# Patient Record
Sex: Female | Born: 1970 | Hispanic: Yes | Marital: Married | Smoking: Never smoker
Health system: Southern US, Community
[De-identification: ages and names within clinical notes are randomized; demographics above are authoritative.]

## PROBLEM LIST (undated history)

## (undated) DIAGNOSIS — N946 Dysmenorrhea, unspecified: Secondary | ICD-10-CM

## (undated) DIAGNOSIS — N921 Excessive and frequent menstruation with irregular cycle: Secondary | ICD-10-CM

## (undated) HISTORY — PX: UMBILICAL HERNIA REPAIR: SHX196

## (undated) HISTORY — DX: Excessive and frequent menstruation with irregular cycle: N92.1

## (undated) HISTORY — DX: Dysmenorrhea, unspecified: N94.6

## (undated) HISTORY — PX: TONSILLECTOMY: SUR1361

---

## 1999-05-09 ENCOUNTER — Other Ambulatory Visit: Admission: RE | Admit: 1999-05-09 | Discharge: 1999-05-09 | Payer: Self-pay | Admitting: Gynecology

## 1999-07-04 ENCOUNTER — Encounter: Admission: RE | Admit: 1999-07-04 | Discharge: 1999-07-04 | Payer: Self-pay | Admitting: Family Medicine

## 1999-07-14 ENCOUNTER — Other Ambulatory Visit: Admission: RE | Admit: 1999-07-14 | Discharge: 1999-07-14 | Payer: Self-pay | Admitting: Obstetrics & Gynecology

## 1999-07-14 ENCOUNTER — Encounter: Admission: RE | Admit: 1999-07-14 | Discharge: 1999-07-14 | Payer: Self-pay | Admitting: Family Medicine

## 1999-07-20 ENCOUNTER — Ambulatory Visit (HOSPITAL_COMMUNITY): Admission: RE | Admit: 1999-07-20 | Discharge: 1999-07-20 | Payer: Self-pay | Admitting: Family Medicine

## 1999-08-02 ENCOUNTER — Ambulatory Visit (HOSPITAL_COMMUNITY): Admission: RE | Admit: 1999-08-02 | Discharge: 1999-08-02 | Payer: Self-pay | Admitting: Family Medicine

## 1999-10-05 ENCOUNTER — Encounter: Admission: RE | Admit: 1999-10-05 | Discharge: 1999-10-05 | Payer: Self-pay | Admitting: Sports Medicine

## 1999-10-13 ENCOUNTER — Encounter: Admission: RE | Admit: 1999-10-13 | Discharge: 1999-10-13 | Payer: Self-pay | Admitting: Family Medicine

## 1999-11-01 ENCOUNTER — Encounter: Admission: RE | Admit: 1999-11-01 | Discharge: 1999-11-01 | Payer: Self-pay | Admitting: Family Medicine

## 1999-12-26 ENCOUNTER — Encounter: Admission: RE | Admit: 1999-12-26 | Discharge: 1999-12-26 | Payer: Self-pay | Admitting: Sports Medicine

## 2000-01-02 ENCOUNTER — Encounter: Admission: RE | Admit: 2000-01-02 | Discharge: 2000-01-02 | Payer: Self-pay | Admitting: *Deleted

## 2000-01-02 ENCOUNTER — Inpatient Hospital Stay (HOSPITAL_COMMUNITY): Admission: AD | Admit: 2000-01-02 | Discharge: 2000-01-04 | Payer: Self-pay | Admitting: Obstetrics

## 2000-02-23 ENCOUNTER — Encounter: Admission: RE | Admit: 2000-02-23 | Discharge: 2000-02-23 | Payer: Self-pay | Admitting: Obstetrics & Gynecology

## 2001-12-05 ENCOUNTER — Encounter: Payer: Self-pay | Admitting: Obstetrics

## 2001-12-05 ENCOUNTER — Ambulatory Visit (HOSPITAL_COMMUNITY): Admission: RE | Admit: 2001-12-05 | Discharge: 2001-12-05 | Payer: Self-pay | Admitting: Obstetrics

## 2002-07-23 ENCOUNTER — Encounter (INDEPENDENT_AMBULATORY_CARE_PROVIDER_SITE_OTHER): Payer: Self-pay | Admitting: *Deleted

## 2002-07-23 ENCOUNTER — Ambulatory Visit (HOSPITAL_BASED_OUTPATIENT_CLINIC_OR_DEPARTMENT_OTHER): Admission: RE | Admit: 2002-07-23 | Discharge: 2002-07-23 | Payer: Self-pay | Admitting: Surgery

## 2003-10-05 ENCOUNTER — Encounter: Admission: RE | Admit: 2003-10-05 | Discharge: 2003-10-05 | Payer: Self-pay | Admitting: Obstetrics

## 2004-03-27 ENCOUNTER — Encounter: Admission: RE | Admit: 2004-03-27 | Discharge: 2004-03-27 | Payer: Self-pay | Admitting: Obstetrics

## 2005-02-08 ENCOUNTER — Ambulatory Visit (HOSPITAL_COMMUNITY): Admission: RE | Admit: 2005-02-08 | Discharge: 2005-02-08 | Payer: Self-pay | Admitting: Obstetrics

## 2010-07-09 ENCOUNTER — Encounter: Payer: Self-pay | Admitting: Obstetrics

## 2010-11-03 NOTE — Op Note (Signed)
[name], Christine Mckinney                          ACCOUNT NO.:  [id number]   MEDICAL RECORD NO.:  [id number]                   PATIENT TYPE:  AMB   LOCATION:  DSC                                  FACILITY:   PHYSICIAN:  Currie Paris, M.D.           DATE OF BIRTH:  [birth year]   DATE OF PROCEDURE:  07/23/2002  DATE OF DISCHARGE:                                 OPERATIVE REPORT   OFFICE CHART NUMBER:  [id number].   PREOPERATIVE DIAGNOSIS:  Lipoma upper back, lower neck area.   POSTOPERATIVE DIAGNOSIS:  Lipoma upper back, lower neck area.   OPERATION:  Excision mass posterior upper back, lower neck area.   SURGEON:  Currie Paris, M.D.   ANESTHESIA:  General.   CLINICAL HISTORY:  This patient has presented with a mass to the back of the  neck just about the shoulder level in the midline which is sore and she  thought it was getting a little bit bigger and she wished to have this  removed.   DESCRIPTION OF PROCEDURE:  The patient was seen in the holding area.  The  mass was identified and marked.  She had no further questions and was  prepared and ready for surgery.  The patient was taken to the operating room  and after satisfactory general anesthesia had been obtained was turned to  the prone position.  The area over the mass was prepped and draped.  I  injected 0.25% plain Marcaine to help with postop analgesia.  I made a  transverse incision over the mass for the length of the mass and divided  some subcutaneous tissue with cautery.  This appeared to be primarily fatty  tissue with a lot of fibrous bands around it and using cautery I elevated  some fairly thick skin flaps going in all directions and then came under the  mass itself.  Again, this appeared to be all lipomatous type tissue within  fibrous bands but I left a fatty tissue posteriorly as well so we did not  get down on the deep margin to muscle fascia.  Once this was excised the  bleeding was controlled  with the cautery and I injected a little bit more  Marcaine to help, again, with postoperative analgesia.   The incision was closed with some 3-0 Vicryl to tack the flaps down so they  would be less likely to have a seroma and then the skin with 4-0 Monocryl  and subcuticular with Steri-Strips.  The patient tolerated the procedure  well and no intraoperative complications and all counts were correct.                                               Currie Paris, M.D.    CJS/MEDQ  D:  07/23/2002  T:  07/23/2002  Job:  [id number]   cc:   Gabriel Earing, M.D.  [address]  [place]  [place] [address]  Fax: [phone]

## 2010-11-03 NOTE — H&P (Signed)
[facility]  Patient:    Christine Mckinney, Christine Mckinney                       MRN: [id number] Adm. Date:  [id number] Disc. Date: [id number] Attending:  Tammi Sou CC:         [facility]   History and Physical  CHIEF COMPLAINT:              The patient is a 40 year old para 3, who desires a sterilization procedure.  HISTORY OF PRESENT ILLNESS:   The patient has used Depo-Provera and oral contraceptives in the past.  The risks, benefits, and alternative forms of contraception were reviewed with the patient, including, but not limited to the risk of failure of 4-01/999 cases, with a subsequent increased risk of an ectopic gestation.  ALLERGIES:                    No known drug allergies.  CURRENT MEDICATIONS:          None.  PAST OB/GYN HISTORY:          She is status post one cesarean delivery and two spontaneous vaginal deliveries.  She denies any sexually-transmitted diseases. A Pap smear from January 2001, was within normal limits.  PAST MEDICAL HISTORY:         She denies past surgical history.  As above.  SOCIAL HISTORY:               She is married.  She denies any tobacco, ethanol, or drug use.  FAMILY HISTORY:               Remarkable for diabetes mellitus and osteoporosis.  PHYSICAL EXAMINATION:  VITAL SIGNS:                  Pulse 83, blood pressure 100/60, weight 99.8 pounds.  GENERAL:                      She is well-developed, well-nourished, in no apparent distress.  HEENT:                        Normocephalic, atraumatic.  NECK:                         Supple, no thyromegaly.  LUNGS:                        Clear to auscultation bilaterally.  HEART:                        Regular rate and rhythm.  ABDOMEN:                      No organomegaly.  PELVIC:                       Normal external female genitalia.  On speculum examination there is a flocculent white discharge noted.  BIMANUAL EXAMINATION:          Uterus is small, retroverted, nontender.  The right adnexa is somewhat full, nontender.  The left adnexa is palpable, nontender.  No masses.  EXTREMITIES:                  No cyanosis, clubbing, or edema.  ASSESSMENT:  Multiparity, desires a sterilization procedure.  PLAN:                         Laparoscopic bilateral tubal ligation. DD:  02/23/00 TD:  02/23/00 Job: [id number]

## 2011-02-12 ENCOUNTER — Other Ambulatory Visit: Payer: Self-pay | Admitting: Obstetrics

## 2012-09-09 ENCOUNTER — Encounter: Payer: Self-pay | Admitting: *Deleted

## 2012-09-11 ENCOUNTER — Ambulatory Visit (INDEPENDENT_AMBULATORY_CARE_PROVIDER_SITE_OTHER): Payer: BC Managed Care – PPO | Admitting: Obstetrics

## 2012-09-11 ENCOUNTER — Encounter: Payer: Self-pay | Admitting: Obstetrics

## 2012-09-11 VITALS — BP 129/79 | HR 81 | Temp 98.9°F | Ht 62.0 in | Wt 119.0 lb

## 2012-09-11 DIAGNOSIS — Z131 Encounter for screening for diabetes mellitus: Secondary | ICD-10-CM

## 2012-09-11 DIAGNOSIS — Z13220 Encounter for screening for lipoid disorders: Secondary | ICD-10-CM

## 2012-09-11 DIAGNOSIS — Z124 Encounter for screening for malignant neoplasm of cervix: Secondary | ICD-10-CM

## 2012-09-11 DIAGNOSIS — N949 Unspecified condition associated with female genital organs and menstrual cycle: Secondary | ICD-10-CM

## 2012-09-11 DIAGNOSIS — Z1239 Encounter for other screening for malignant neoplasm of breast: Secondary | ICD-10-CM

## 2012-09-11 DIAGNOSIS — Z01419 Encounter for gynecological examination (general) (routine) without abnormal findings: Secondary | ICD-10-CM

## 2012-09-11 MED ORDER — IBUPROFEN 800 MG PO TABS: 800.0000 mg | ORAL_TABLET | Freq: Three times a day (TID) | ORAL | Status: DC | PRN

## 2012-09-11 MED ORDER — HYDROCODONE-IBUPROFEN 7.5-200 MG PO TABS: 1.0000 | ORAL_TABLET | Freq: Three times a day (TID) | ORAL | Status: DC | PRN

## 2012-09-11 MED ORDER — PRENATAL PLUS 27-1 MG PO TABS: 1.0000 | ORAL_TABLET | Freq: Every day | ORAL | Status: DC

## 2012-09-11 NOTE — Patient Instructions (Signed)
Routine well woman instructions.

## 2012-09-11 NOTE — Progress Notes (Signed)
  Subjective:     Christine Mckinney is a 42 y.o. female and is here for a comprehensive physical exam. The patient reports problems - LLQ pain off and on..  History   Social History  . Marital Status: Married    Spouse Name: Louie Casa     Number of Children: 3  . Years of Education: N/A   Occupational History  . Inspector    Social History Main Topics  . Smoking status: Never Smoker   . Smokeless tobacco: Never Used  . Alcohol Use: No  . Drug Use: No  . Sexually Active: Yes -- Female partner(s)     Comment: spouse had vasectomy   Other Topics Concern  . Not on file   Social History Narrative  . No narrative on file   Health Maintenance  Topic Date Due  . Influenza Vaccine  02/16/1973  . Tetanus/tdap  07/01/1991  . Pap Smear  01/28/2014    The following portions of the patient's history were reviewed and updated as appropriate: allergies, current medications, past family history, past medical history, past social history, past surgical history and problem list.  Review of Systems A comprehensive review of systems was negative except for: LLQ pain   Objective:  Breasts: Normal Pelvic:  NEFG              Vagina and cervix normal.               Cervix normal.  Pap done                Uterus:  NSSC.  NT                 Adnexa:  Tender left.  No masses.      Assessment:    Healthy female exam.  Pelvic pain.  R/O ovarian cyst on left side.      Plan:     Pelvic ultrasound and mammogram ordered.  Patient will return for fasting labs. See After Visit Summary for Counseling Recommendations

## 2012-09-12 LAB — PAP IG W/ RFLX HPV ASCU

## 2012-09-18 ENCOUNTER — Ambulatory Visit (HOSPITAL_COMMUNITY)
Admission: RE | Admit: 2012-09-18 | Discharge: 2012-09-18 | Disposition: A | Discharge status: Home / Self Care | Payer: BC Managed Care – PPO | Source: Ambulatory Visit | Attending: Obstetrics | Admitting: Obstetrics

## 2012-09-18 DIAGNOSIS — Z1231 Encounter for screening mammogram for malignant neoplasm of breast: Secondary | ICD-10-CM | POA: Insufficient documentation

## 2012-09-18 DIAGNOSIS — Z01419 Encounter for gynecological examination (general) (routine) without abnormal findings: Secondary | ICD-10-CM

## 2013-08-28 ENCOUNTER — Ambulatory Visit (INDEPENDENT_AMBULATORY_CARE_PROVIDER_SITE_OTHER): Payer: BC Managed Care – PPO | Admitting: Internal Medicine

## 2013-08-28 ENCOUNTER — Ambulatory Visit: Payer: BC Managed Care – PPO

## 2013-08-28 VITALS — BP 110/70 | HR 85 | Temp 98.0°F | Resp 16 | Ht 61.0 in | Wt 129.0 lb

## 2013-08-28 DIAGNOSIS — M25512 Pain in left shoulder: Secondary | ICD-10-CM

## 2013-08-28 DIAGNOSIS — R221 Localized swelling, mass and lump, neck: Secondary | ICD-10-CM

## 2013-08-28 DIAGNOSIS — M25519 Pain in unspecified shoulder: Secondary | ICD-10-CM

## 2013-08-28 DIAGNOSIS — M542 Cervicalgia: Secondary | ICD-10-CM

## 2013-08-28 DIAGNOSIS — R220 Localized swelling, mass and lump, head: Secondary | ICD-10-CM

## 2013-08-28 MED ORDER — HYDROCODONE-ACETAMINOPHEN 5-325 MG PO TABS: 1.0000 | ORAL_TABLET | Freq: Four times a day (QID) | ORAL | Status: DC | PRN

## 2013-08-28 MED ORDER — PREDNISONE 10 MG PO TABS: ORAL_TABLET | ORAL | Status: DC

## 2013-08-28 NOTE — Progress Notes (Signed)
   Subjective:    Patient ID: Christine Mckinney, female    DOB: Sep 26, 1970, 43 y.o.   MRN: [id number]  HPI Patient presents with left sided neck pain that is now radiating down into left shoulder and left arm.  Patient says its very painful to use her neck and arm.  Patient complains of neck pain for 2 to 3 months.Only a week ago did her shoulder and arm become painful.  She saw her PCP on Monday.  She was given Flexeril and Naproxen.  Neither have helped very much. Also states swelling has recently occurred base of left neck and base of cspine at site of lipoma removal, there is swelling below scar. She is new to our clinic and [facility]. She has 2 sisters with 3 cancers! Review of Systems Hx surgery on neck mass 10 yrs ago    Objective:   Physical Exam  Constitutional: She is oriented to person, place, and time. She appears well-developed and well-nourished.  HENT:  Head: Normocephalic.  Eyes: EOM are normal. Pupils are equal, round, and reactive to light.  Neck: Normal range of motion.  Pulmonary/Chest: Effort normal.  Musculoskeletal:       Cervical back: She exhibits tenderness, bony tenderness, swelling, deformity and pain. She exhibits normal range of motion and normal pulse.  Lymphadenopathy:    She has no cervical adenopathy.  Neurological: She is alert and oriented to person, place, and time. No cranial nerve deficit. She exhibits normal muscle tone. Coordination normal.  Skin: No rash noted.  Psychiatric: She has a normal mood and affect. Her behavior is normal. Judgment and thought content normal.      [facility] Policy for Prescribing Controlled Substances (Revised 04/2012) 1. Prescriptions for controlled substances will be filled by ONE provider at [facility] with whom you have established and developed a plan for your care, including follow-up. 2. You are encouraged to schedule an appointment with your prescriber at our appointment center for follow-up visits  whenever possible. 3. If you request a prescription for the controlled substance while at [facility] an acute problem (with someone other than your regular prescriber), you MAY be given a ONE-TIME prescription for a 30-day supply of the controlled substance, to allow time for you to return to see your regular prescriber for additional prescriptions.[facility]  [facility] reading (PRIMARY) by  Dr.Madeleyn Schwimmer cspine appears normal, prominent soft tissue mass base of neck      Assessment & Plan:  Neck radiating to left shoulder pain/Neck manual Neck swelling progressive cause unclear/Soft mass Refer for MRI neck mass r/o liposarcoma Prednisone/Vicodin F/up closely

## 2013-08-28 NOTE — Patient Instructions (Signed)

## 2013-09-08 ENCOUNTER — Ambulatory Visit
Admission: RE | Admit: 2013-09-08 | Discharge: 2013-09-08 | Disposition: A | Discharge status: Home / Self Care | Payer: BC Managed Care – PPO | Source: Ambulatory Visit | Attending: Internal Medicine | Admitting: Internal Medicine

## 2013-09-08 DIAGNOSIS — R221 Localized swelling, mass and lump, neck: Secondary | ICD-10-CM

## 2013-09-08 DIAGNOSIS — M542 Cervicalgia: Secondary | ICD-10-CM

## 2013-09-08 DIAGNOSIS — M25512 Pain in left shoulder: Secondary | ICD-10-CM

## 2013-09-08 MED ORDER — GADOBENATE DIMEGLUMINE 529 MG/ML IV SOLN
10.0000 mL | Freq: Once | INTRAVENOUS | Status: AC | PRN
  Administered 2013-09-08: 10 mL via INTRAVENOUS

## 2013-09-09 ENCOUNTER — Telehealth: Payer: Self-pay

## 2013-09-09 ENCOUNTER — Ambulatory Visit: Payer: BC Managed Care – PPO

## 2013-09-09 ENCOUNTER — Ambulatory Visit (INDEPENDENT_AMBULATORY_CARE_PROVIDER_SITE_OTHER): Payer: BC Managed Care – PPO | Admitting: Family Medicine

## 2013-09-09 VITALS — BP 108/78 | HR 90 | Temp 98.5°F | Resp 18 | Wt 134.0 lb

## 2013-09-09 DIAGNOSIS — M25519 Pain in unspecified shoulder: Secondary | ICD-10-CM

## 2013-09-09 DIAGNOSIS — M5010 Cervical disc disorder with radiculopathy, unspecified cervical region: Secondary | ICD-10-CM

## 2013-09-09 DIAGNOSIS — R202 Paresthesia of skin: Secondary | ICD-10-CM

## 2013-09-09 DIAGNOSIS — R209 Unspecified disturbances of skin sensation: Secondary | ICD-10-CM

## 2013-09-09 DIAGNOSIS — M542 Cervicalgia: Secondary | ICD-10-CM

## 2013-09-09 DIAGNOSIS — M5412 Radiculopathy, cervical region: Secondary | ICD-10-CM

## 2013-09-09 MED ORDER — MELOXICAM 15 MG PO TABS: 15.0000 mg | ORAL_TABLET | Freq: Every day | ORAL | Status: DC

## 2013-09-09 MED ORDER — METHOCARBAMOL 500 MG PO TABS: 500.0000 mg | ORAL_TABLET | Freq: Four times a day (QID) | ORAL | Status: DC | PRN

## 2013-09-09 MED ORDER — OXYCODONE-ACETAMINOPHEN 5-325 MG PO TABS: 1.0000 | ORAL_TABLET | Freq: Four times a day (QID) | ORAL | Status: DC | PRN

## 2013-09-09 NOTE — Progress Notes (Signed)
Chief Complaint:  Chief Complaint  Patient presents with  . Follow-up    neck/shoulder pain; had MRI yesterday; wants cortisone shot    HPI: Christine Mckinney is a 43 y.o. female who is here for  3 month history of neck and shoulder pain.  She has associated numbness and tingling that travels on the back of her shoulder blade to the lateral deltoid and down her inner arm. She has pain, weakness and n/t.  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 No prior neck or shoulder injury Has numbness , tingling, constant pain, 10/10 , sharp jabbing pain. She cannot sleep or get comfortable during the day  On her last visit she was given vicoprofen  and prednisone and that did not help, bothered her stomach , helps for a little bit but does not want to take pills all the time. She wants an injection or the problem to be fixed since it bothers her so much.  She was on naproxen and flexeril in the past and that did not help  She had MRI of soft tissue neck done to rule out liposarcoma today, 09/09/2013  IMPRESSION:  Abundant subcutaneous fat posteriorly at the junction of the neck  and back. The appearance is nonspecific. There is no definable  well-circumscribed lipoma.  No other soft tissue finding in the neck.  Left posterior lateral disc herniation at C5-6 that could be a cause  of left-sided pain. Detailed exam of the cervical spine was not  performed.    Past Medical History  Diagnosis Date  . Dysmenorrhea   . Metrorrhagia    Past Surgical History  Procedure Laterality Date  . Umbilical hernia repair     History   Social History  . Marital Status: Married    Spouse Name: Louie CasaHector Guevara     Number of Children: 3  . Years of Education: N/A   Occupational History  . Inspector    Social History Main Topics  . Smoking status: Never Smoker   . Smokeless tobacco: Never Used  . Alcohol Use: No  . Drug  Use: No  . Sexual Activity: Yes    Partners: Male     Comment: spouse had vasectomy   Other Topics Concern  . None   Social History Narrative  . None   Family History  Problem Relation Age of Onset  . Diabetes Mellitus II    . Heart disease    . Cervical cancer    . Osteoporosis    . Hyperlipidemia     No Known Allergies Prior to Admission medications   Medication Sig Start Date End Date Taking? Authorizing Provider  ibuprofen (ADVIL,MOTRIN) 800 MG tablet Take 1 tablet (800 mg total) by mouth every 8 (eight) hours as needed for pain. 09/11/12  Yes Brock Badharles A Harper, MD  naproxen sodium (ANAPROX) 550 MG tablet Take 550 mg by mouth 2 (two) times daily with a meal.   Yes Historical Provider, MD  prenatal vitamin w/FE, FA (PRENATAL 1 + 1) 27-1 MG TABS Take 1 tablet by mouth daily at 12 noon. 09/11/12  Yes Brock Badharles A Harper, MD  cyclobenzaprine (FLEXERIL) 10 MG tablet Take 10 mg by mouth 3 (three) times daily as needed for muscle spasms.    Historical Provider, MD  HYDROcodone-acetaminophen (NORCO/VICODIN) 5-325 MG per tablet Take 1 tablet by mouth every 6 (six) hours as needed. 08/28/13  Jonita Albee, MD  HYDROcodone-ibuprofen (VICOPROFEN) 7.5-200 MG per tablet Take 1 tablet by mouth every 8 (eight) hours as needed for pain. 09/11/12   Brock Bad, MD  predniSONE (DELTASONE) 10 MG tablet 6-5-09-19-19-po pc for nerve pain 08/28/13   Jonita Albee, MD     ROS: The patient denies fevers, chills, night sweats, unintentional weight loss, chest pain, palpitations, wheezing, dyspnea on exertion, nausea, vomiting, abdominal pain, dysuria, hematuria, melena,+  numbness, weakness, or tingling.   All other systems have been reviewed and were otherwise negative with the exception of those mentioned in the HPI and as above.    PHYSICAL EXAM: Filed Vitals:   09/09/13 1638  BP: 108/78  Pulse: 90  Temp: 98.5 F (36.9 C)  Resp: 18   Filed Vitals:   09/09/13 1638  Weight: 134 lb (60.782 kg)    Body mass index is 25.33 kg/(m^2).  General: Alert, mild-moderate acute distress HEENT:  Normocephalic, atraumatic, oropharynx patent. EOMI, PERRLA Cardiovascular:  Regular rate and rhythm, no rubs murmurs or gallops.  No Carotid bruits, radial pulse intact. No pedal edema.  Respiratory: Clear to auscultation bilaterally.  No wheezes, rales, or rhonchi.  No cyanosis, no use of accessory musculature GI: No organomegaly, abdomen is soft and non-tender, positive bowel sounds.  No masses. Skin: No rashes. Neurologic: Facial musculature symmetric. Psychiatric: Patient is appropriate throughout our interaction. Lymphatic: No cervical lymphadenopathy Musculoskeletal: Gait intact. Decrease ROM neck due to pain Decrease ROM left shoulder due to pain, however when she is able to push through pain , she is able to get full ROM 4/5 strength ,decrease sensation to light touch Distribution is along c 5-6 Neg empty can   LABS: Results for orders placed in visit on 09/11/12  PAP IG W/ RFLX HPV ASCU      Result Value Ref Range   Specimen adequacy:       FINAL DIAGNOSIS:       COMMENTS:       Cytotechnologist:         EKG/XRAY:   Primary read interpreted by Dr. Conley Rolls at [facility]. Neg for fracture /dislocation     ASSESSMENT/PLAN: Encounter Diagnoses  Name Primary?  . Shoulder pain   . Neck pain   . Paresthesia   . Cervical disc disorder with radiculopathy of cervical region Yes    Needs referral to neuorsurgery for worsening cervical radicular pain c4-6 She wanted an injection , she would not have benefited from it.  Shoulder xrays neg for DJD, fx,dislocation Stop allprior meds Rx Robaxin, Mobic, and also Percocet Work note given, patient was given all xrays and report of soft tissue MRI Fu prn  Gross sideeffects, risk and benefits, and alternatives of medications d/w patient. Patient is aware that all medications have potential sideeffects and we are unable to predict every sideeffect  or drug-drug interaction that may occur.  Rockne Coons, DO 09/09/2013 9:24 PM   09/10/2013 Spoke with referral coordinator , Thayer Ohm,  and she will contact pt when she gets referral forms from our office The patient is tentatively scheduled to see neurosurgery on April 1 @ 12:30 [facility] [address] [place], [place] [address] Phone:[phone] Fax : [phone]

## 2013-09-09 NOTE — Patient Instructions (Signed)
Cervical Radiculopathy  Cervical radiculopathy happens when a nerve in the neck is pinched or bruised by a slipped (herniated) disk or by arthritic changes in the bones of the cervical spine. This can occur due to an injury or as part of the normal aging process. Pressure on the cervical nerves can cause pain or numbness that runs from your neck all the way down into your arm and fingers.  CAUSES   There are many possible causes, including:  · Injury.  · Muscle tightness in the neck from overuse.  · Swollen, painful joints (arthritis).  · Breakdown or degeneration in the bones and joints of the spine (spondylosis) due to aging.  · Bone spurs that may develop near the cervical nerves.  SYMPTOMS   Symptoms include pain, weakness, or numbness in the affected arm and hand. Pain can be severe or irritating. Symptoms may be worse when extending or turning the neck.  DIAGNOSIS   Your caregiver will ask about your symptoms and do a physical exam. He or she may test your strength and reflexes. X-rays, CT scans, and MRI scans may be needed in cases of injury or if the symptoms do not go away after a period of time. Electromyography (EMG) or nerve conduction testing may be done to study how your nerves and muscles are working.  TREATMENT   Your caregiver may recommend certain exercises to help relieve your symptoms. Cervical radiculopathy can, and often does, get better with time and treatment. If your problems continue, treatment options may include:  · Wearing a soft collar for short periods of time.  · Physical therapy to strengthen the neck muscles.  · Medicines, such as nonsteroidal anti-inflammatory drugs (NSAIDs), oral corticosteroids, or spinal injections.  · Surgery. Different types of surgery may be done depending on the cause of your problems.  HOME CARE INSTRUCTIONS   · Put ice on the affected area.  · Put ice in a plastic bag.  · Place a towel between your skin and the bag.  · Leave the ice on for 15-20 minutes,  03-04 times a day or as directed by your caregiver.  · If ice does not help, you can try using heat. Take a warm shower or bath, or use a hot water bottle as directed by your caregiver.  · You may try a gentle neck and shoulder massage.  · Use a flat pillow when you sleep.  · Only take over-the-counter or prescription medicines for pain, discomfort, or fever as directed by your caregiver.  · If physical therapy was prescribed, follow your caregiver's directions.  · If a soft collar was prescribed, use it as directed.  SEEK IMMEDIATE MEDICAL CARE IF:   · Your pain gets much worse and cannot be controlled with medicines.  · You have weakness or numbness in your hand, arm, face, or leg.  · You have a high fever or a stiff, rigid neck.  · You lose bowel or bladder control (incontinence).  · You have trouble with walking, balance, or speaking.  MAKE SURE YOU:   · Understand these instructions.  · Will watch your condition.  · Will get help right away if you are not doing well or get worse.  Document Released: 02/27/2001 Document Revised: 08/27/2011 Document Reviewed: 01/16/2011  [employer]® Patient Information ©2014 [employer].

## 2013-09-09 NOTE — Telephone Encounter (Signed)
PT STATES SHE IS STILL IN A LOT OF PAIN AND WOULD LIKE TO KNOW IF SHE CAN GET A CORTISONE SHOT, DIDN'T WANT TO COME BACK IN IF SHE CAN'T PLEASE CALL [phone]

## 2013-09-09 NOTE — Telephone Encounter (Signed)
Spoke to pt, she is outside the clinic.  She is going to be seen for the shoulder pain today.

## 2013-09-14 ENCOUNTER — Encounter: Payer: Self-pay | Admitting: Radiology

## 2014-01-13 ENCOUNTER — Other Ambulatory Visit: Payer: Self-pay | Admitting: Obstetrics

## 2014-01-13 DIAGNOSIS — Z1231 Encounter for screening mammogram for malignant neoplasm of breast: Secondary | ICD-10-CM

## 2014-01-18 ENCOUNTER — Ambulatory Visit (HOSPITAL_COMMUNITY)
Admission: RE | Admit: 2014-01-18 | Discharge: 2014-01-18 | Disposition: A | Discharge status: Home / Self Care | Payer: BC Managed Care – PPO | Source: Ambulatory Visit | Attending: Obstetrics | Admitting: Obstetrics

## 2014-01-18 DIAGNOSIS — Z1231 Encounter for screening mammogram for malignant neoplasm of breast: Secondary | ICD-10-CM

## 2014-07-30 ENCOUNTER — Emergency Department (HOSPITAL_BASED_OUTPATIENT_CLINIC_OR_DEPARTMENT_OTHER)
Admission: EM | Admit: 2014-07-30 | Discharge: 2014-07-30 | Disposition: A | Discharge status: Home / Self Care | Payer: Worker's Compensation | Attending: Emergency Medicine | Admitting: Emergency Medicine

## 2014-07-30 ENCOUNTER — Encounter (HOSPITAL_BASED_OUTPATIENT_CLINIC_OR_DEPARTMENT_OTHER): Payer: Self-pay

## 2014-07-30 DIAGNOSIS — Z791 Long term (current) use of non-steroidal anti-inflammatories (NSAID): Secondary | ICD-10-CM | POA: Diagnosis not present

## 2014-07-30 DIAGNOSIS — Z8742 Personal history of other diseases of the female genital tract: Secondary | ICD-10-CM | POA: Diagnosis not present

## 2014-07-30 DIAGNOSIS — M5417 Radiculopathy, lumbosacral region: Secondary | ICD-10-CM | POA: Diagnosis not present

## 2014-07-30 DIAGNOSIS — Z79899 Other long term (current) drug therapy: Secondary | ICD-10-CM | POA: Diagnosis not present

## 2014-07-30 DIAGNOSIS — M549 Dorsalgia, unspecified: Secondary | ICD-10-CM | POA: Diagnosis present

## 2014-07-30 MED ORDER — KETOROLAC TROMETHAMINE 60 MG/2ML IM SOLN
60.0000 mg | Freq: Once | INTRAMUSCULAR | Status: AC
  Administered 2014-07-30: 60 mg via INTRAMUSCULAR
  Filled 2014-07-30: qty 2

## 2014-07-30 MED ORDER — HYDROCODONE-ACETAMINOPHEN 5-325 MG PO TABS: 1.0000 | ORAL_TABLET | ORAL | Status: DC | PRN

## 2014-07-30 MED ORDER — METHOCARBAMOL 500 MG PO TABS: 500.0000 mg | ORAL_TABLET | Freq: Two times a day (BID) | ORAL | Status: DC

## 2014-07-30 MED ORDER — PREDNISONE 20 MG PO TABS: 40.0000 mg | ORAL_TABLET | Freq: Every day | ORAL | Status: DC

## 2014-07-30 NOTE — Discharge Instructions (Signed)
Prednisone as prescribed until all gone for inflammation. norco as prescribed as needed for severe pain. Robaxin for spasms. Follow up with your doctor for recheck.    Lumbosacral Radiculopathy Lumbosacral radiculopathy is a pinched nerve or nerves in the low back (lumbosacral area). When this happens you may have weakness in your legs and may not be able to stand on your toes. You may have pain going down into your legs. There may be difficulties with walking normally. There are many causes of this problem. Sometimes this may happen from an injury, or simply from arthritis or boney problems. It may also be caused by other illnesses such as diabetes. If there is no improvement after treatment, further studies may be done to find the exact cause. DIAGNOSIS  X-rays may be needed if the problems become long standing. Electromyograms may be done. This study is one in which the working of nerves and muscles is studied. HOME CARE INSTRUCTIONS   Applications of ice packs may be helpful. Ice can be used in a plastic bag with a towel around it to prevent frostbite to skin. This may be used every 2 hours for 20 to 30 minutes, or as needed, while awake, or as directed by your caregiver.  Only take over-the-counter or prescription medicines for pain, discomfort, or fever as directed by your caregiver.  If physical therapy was prescribed, follow your caregiver's directions. SEEK IMMEDIATE MEDICAL CARE IF:   You have pain not controlled with medications.  You seem to be getting worse rather than better.  You develop increasing weakness in your legs.  You develop loss of bowel or bladder control.  You have difficulty with walking or balance, or develop clumsiness in the use of your legs.  You have a fever. MAKE SURE YOU:   Understand these instructions.  Will watch your condition.  Will get help right away if you are not doing well or get worse. Document Released: 06/04/2005 Document Revised:  08/27/2011 Document Reviewed: 01/23/2008 [facility] Patient Information 2015 [place], [place]. This information is not intended to replace advice given to you by your health care provider. Make sure you discuss any questions you have with your health care provider.   Back Exercises These exercises may help you when beginning to rehabilitate your injury. Your symptoms may resolve with or without further involvement from your physician, physical therapist or athletic trainer. While completing these exercises, remember:   Restoring tissue flexibility helps normal motion to return to the joints. This allows healthier, less painful movement and activity.  An effective stretch should be held for at least 30 seconds.  A stretch should never be painful. You should only feel a gentle lengthening or release in the stretched tissue. STRETCH - Extension, Prone on Elbows   Lie on your stomach on the floor, a bed will be too soft. Place your palms about shoulder width apart and at the height of your head.  Place your elbows under your shoulders. If this is too painful, stack pillows under your chest.  Allow your body to relax so that your hips drop lower and make contact more completely with the floor.  Hold this position for __________ seconds.  Slowly return to lying flat on the floor. Repeat __________ times. Complete this exercise __________ times per day.  RANGE OF MOTION - Extension, Prone Press Ups   Lie on your stomach on the floor, a bed will be too soft. Place your palms about shoulder width apart and at the height of  your head.  Keeping your back as relaxed as possible, slowly straighten your elbows while keeping your hips on the floor. You may adjust the placement of your hands to maximize your comfort. As you gain motion, your hands will come more underneath your shoulders.  Hold this position __________ seconds.  Slowly return to lying flat on the floor. Repeat __________ times.  Complete this exercise __________ times per day.  RANGE OF MOTION- Quadruped, Neutral Spine   Assume a hands and knees position on a firm surface. Keep your hands under your shoulders and your knees under your hips. You may place padding under your knees for comfort.  Drop your head and point your tail bone toward the ground below you. This will round out your low back like an angry cat. Hold this position for __________ seconds.  Slowly lift your head and release your tail bone so that your back sags into a large arch, like an old horse.  Hold this position for __________ seconds.  Repeat this until you feel limber in your low back.  Now, find your "sweet spot." This will be the most comfortable position somewhere between the two previous positions. This is your neutral spine. Once you have found this position, tense your stomach muscles to support your low back.  Hold this position for __________ seconds. Repeat __________ times. Complete this exercise __________ times per day.  STRETCH - Flexion, Single Knee to Chest   Lie on a firm bed or floor with both legs extended in front of you.  Keeping one leg in contact with the floor, bring your opposite knee to your chest. Hold your leg in place by either grabbing behind your thigh or at your knee.  Pull until you feel a gentle stretch in your low back. Hold __________ seconds.  Slowly release your grasp and repeat the exercise with the opposite side. Repeat __________ times. Complete this exercise __________ times per day.  STRETCH - Hamstrings, Standing  Stand or sit and extend your right / left leg, placing your foot on a chair or foot stool  Keeping a slight arch in your low back and your hips straight forward.  Lead with your chest and lean forward at the waist until you feel a gentle stretch in the back of your right / left knee or thigh. (When done correctly, this exercise requires leaning only a small distance.)  Hold this  position for __________ seconds. Repeat __________ times. Complete this stretch __________ times per day. STRENGTHENING - Deep Abdominals, Pelvic Tilt   Lie on a firm bed or floor. Keeping your legs in front of you, bend your knees so they are both pointed toward the ceiling and your feet are flat on the floor.  Tense your lower abdominal muscles to press your low back into the floor. This motion will rotate your pelvis so that your tail bone is scooping upwards rather than pointing at your feet or into the floor.  With a gentle tension and even breathing, hold this position for __________ seconds. Repeat __________ times. Complete this exercise __________ times per day.  STRENGTHENING - Abdominals, Crunches   Lie on a firm bed or floor. Keeping your legs in front of you, bend your knees so they are both pointed toward the ceiling and your feet are flat on the floor. Cross your arms over your chest.  Slightly tip your chin down without bending your neck.  Tense your abdominals and slowly lift your trunk high enough to  just clear your shoulder blades. Lifting higher can put excessive stress on the low back and does not further strengthen your abdominal muscles.  Control your return to the starting position. Repeat __________ times. Complete this exercise __________ times per day.  STRENGTHENING - Quadruped, Opposite UE/LE Lift   Assume a hands and knees position on a firm surface. Keep your hands under your shoulders and your knees under your hips. You may place padding under your knees for comfort.  Find your neutral spine and gently tense your abdominal muscles so that you can maintain this position. Your shoulders and hips should form a rectangle that is parallel with the floor and is not twisted.  Keeping your trunk steady, lift your right hand no higher than your shoulder and then your left leg no higher than your hip. Make sure you are not holding your breath. Hold this position  __________ seconds.  Continuing to keep your abdominal muscles tense and your back steady, slowly return to your starting position. Repeat with the opposite arm and leg. Repeat __________ times. Complete this exercise __________ times per day. Document Released: 06/22/2005 Document Revised: 08/27/2011 Document Reviewed: 09/16/2008 [facility] Patient Information 2015 [place], [place]. This information is not intended to replace advice given to you by your health care provider. Make sure you discuss any questions you have with your health care provider.

## 2014-07-30 NOTE — ED Provider Notes (Signed)
CSN: [id number]     Arrival date & time 07/30/14  1450 History   First MD Initiated Contact with Patient 07/30/14 1511     Chief Complaint  Patient presents with  . Back Pain     (Consider location/radiation/quality/duration/timing/severity/associated sxs/prior Treatment) HPI Christine Mckinney is a 44 y.o. female with no medical problems, presents to ED complaining of back pain. Pt states she lifted a box yesterday at work, and felt sharp pain in the lower back radiating down right leg. States pain is on the outer part of the leg and foot. Denies numbness or weakness in her leg. No difficulty controlling bowels or bladder. No abdominal pain. No fever or chills. No iv drug use. Has been taking tylenol with no relief of her pain. States sitting down makes pain worse. Nothing makes it better.   Past Medical History  Diagnosis Date  . Dysmenorrhea   . Metrorrhagia    Past Surgical History  Procedure Laterality Date  . Umbilical hernia repair     Family History  Problem Relation Age of Onset  . Diabetes Mellitus II    . Heart disease    . Cervical cancer    . Osteoporosis    . Hyperlipidemia     History  Substance Use Topics  . Smoking status: Never Smoker   . Smokeless tobacco: Never Used  . Alcohol Use: No   OB History    No data available     Review of Systems  Constitutional: Negative for fever and chills.  Respiratory: Negative for cough, chest tightness and shortness of breath.   Cardiovascular: Negative for chest pain, palpitations and leg swelling.  Gastrointestinal: Negative for nausea, vomiting, abdominal pain and diarrhea.  Genitourinary: Negative for dysuria and flank pain.  Musculoskeletal: Positive for back pain and arthralgias.  Skin: Negative for rash.  Neurological: Negative for dizziness, weakness, numbness and headaches.  All other systems reviewed and are negative.     Allergies  Review of patient's allergies indicates no known allergies.  Home  Medications   Prior to Admission medications   Medication Sig Start Date End Date Taking? Authorizing Provider  ibuprofen (ADVIL,MOTRIN) 800 MG tablet Take 800 mg by mouth every 8 (eight) hours as needed.   Yes Historical Provider, MD  meloxicam (MOBIC) 15 MG tablet Take 1 tablet (15 mg total) by mouth daily. Take with food, no other NSAIDs 09/09/13   Thao P Le, DO  methocarbamol (ROBAXIN) 500 MG tablet Take 1 tablet (500 mg total) by mouth every 6 (six) hours as needed for muscle spasms. 09/09/13   Thao P Le, DO  oxyCODONE-acetaminophen (ROXICET) 5-325 MG per tablet Take 1 tablet by mouth every 6 (six) hours as needed for severe pain. No tylenol with this medicine, monitor for constipation 09/09/13   Thao P Le, DO  prenatal vitamin w/FE, FA (PRENATAL 1 + 1) 27-1 MG TABS Take 1 tablet by mouth daily at 12 noon. 09/11/12   Brock Bad, MD   BP 138/80 mmHg  Pulse 73  Temp(Src) 98.3 F (36.8 C) (Oral)  Resp 18  Ht  (1.575 m)  Wt 126 lb (57.153 kg)  BMI 23.04 kg/m2  SpO2 98%  LMP 06/30/2014 Physical Exam  Constitutional: She is oriented to person, place, and time. She appears well-developed and well-nourished. No distress.  HENT:  Head: Normocephalic.  Eyes: Conjunctivae are normal.  Neck: Neck supple.  Cardiovascular: Normal rate, regular rhythm and normal heart sounds.   Pulmonary/Chest: Effort normal  and breath sounds normal. No respiratory distress. She has no wheezes. She has no rales.  Musculoskeletal: She exhibits no edema.  Midline lumbar spine tenderness. Tender in the right SI joint and right buttock. Pain with right straight leg raise  Neurological: She is alert and oriented to person, place, and time.  5/5 and equal lower extremity strength. 2+ and equal patellar reflexes bilaterally. Pt able to dorsiflex bilateral toes and feet with good strength against resistance. Equal sensation bilaterally over thighs and lower legs.   Skin: Skin is warm and dry.  Psychiatric: She  has a normal mood and affect. Her behavior is normal.  Nursing note and vitals reviewed.   ED Course  Procedures (including critical care time) Labs Review Labs Reviewed - No data to display  Imaging Review No results found.   EKG Interpretation None      MDM   Final diagnoses:  Lumbosacral radiculopathy    Pt with lower back pain radiating into the right lower leg, mainly over the lateral aspect. States has had similar pain in the past. Lifting injury, no evidence of cauda equina, neurovascular intact. Most likely radicular pain. Will treat with a steroid pack, Norco, and Robaxin. Follow-up with primary care doctor. Toradol given in the ED, patient drove herself here. Return precautions discussed.  Filed Vitals:   07/30/14 1459  BP: 138/80  Pulse: 73  Temp: 98.3 F (36.8 C)  TempSrc: Oral  Resp: 18  Height: 5\' 2"  (1.575 m)  Weight: 126 lb (57.153 kg)  SpO2: 98%       Lottie Musselatyana A [place] Noori, PA-C 07/30/14 1710  Gwyneth SproutWhitney Plunkett, MD 07/30/14 2257

## 2014-07-30 NOTE — ED Notes (Signed)
Pt reports she was at work yesterday @ [employer] when she lifted a box and experienced lower back pain. Reports pain radiates down right leg with tingling sensation. Denies incontinence. States UDS Panel 5 required.

## 2014-08-20 ENCOUNTER — Encounter: Payer: Self-pay | Admitting: Women's Health

## 2014-08-20 ENCOUNTER — Ambulatory Visit (INDEPENDENT_AMBULATORY_CARE_PROVIDER_SITE_OTHER): Payer: BLUE CROSS/BLUE SHIELD | Admitting: Women's Health

## 2014-08-20 VITALS — BP 120/70 | Ht 61.0 in | Wt 128.0 lb

## 2014-08-20 DIAGNOSIS — Z13220 Encounter for screening for lipoid disorders: Secondary | ICD-10-CM | POA: Diagnosis not present

## 2014-08-20 DIAGNOSIS — Z01419 Encounter for gynecological examination (general) (routine) without abnormal findings: Secondary | ICD-10-CM | POA: Diagnosis not present

## 2014-08-20 DIAGNOSIS — N926 Irregular menstruation, unspecified: Secondary | ICD-10-CM | POA: Diagnosis not present

## 2014-08-20 LAB — COMPREHENSIVE METABOLIC PANEL
ALBUMIN: 4.2 g/dL (ref 3.5–5.2)
ALK PHOS: 69 U/L (ref 39–117)
ALT: 17 U/L (ref 0–35)
AST: 17 U/L (ref 0–37)
BUN: 17 mg/dL (ref 6–23)
CO2: 27 mEq/L (ref 19–32)
CREATININE: 0.92 mg/dL (ref 0.50–1.10)
Calcium: 9.1 mg/dL (ref 8.4–10.5)
Chloride: 103 mEq/L (ref 96–112)
Glucose, Bld: 89 mg/dL (ref 70–99)
POTASSIUM: 3.7 meq/L (ref 3.5–5.3)
Sodium: 139 mEq/L (ref 135–145)
Total Bilirubin: 0.6 mg/dL (ref 0.2–1.2)
Total Protein: 6.6 g/dL (ref 6.0–8.3)

## 2014-08-20 LAB — LIPID PANEL
CHOL/HDL RATIO: 2.4 ratio
Cholesterol: 116 mg/dL (ref 0–200)
HDL: 49 mg/dL (ref >=46)
LDL Cholesterol: 43 mg/dL (ref 0–99)
Triglycerides: 118 mg/dL (ref <150)
VLDL: 24 mg/dL (ref 0–40)

## 2014-08-20 LAB — CBC WITH DIFFERENTIAL/PLATELET
Basophils Absolute: 0.1 10*3/uL (ref 0.0–0.1)
Basophils Relative: 1 % (ref 0–1)
EOS ABS: 0.2 10*3/uL (ref 0.0–0.7)
Eosinophils Relative: 2 % (ref 0–5)
HEMATOCRIT: 39.7 % (ref 36.0–46.0)
HEMOGLOBIN: 13.8 g/dL (ref 12.0–15.0)
LYMPHS ABS: 2.6 10*3/uL (ref 0.7–4.0)
LYMPHS PCT: 32 % (ref 12–46)
MCH: 30.6 pg (ref 26.0–34.0)
MCHC: 34.8 g/dL (ref 30.0–36.0)
MCV: 88 fL (ref 78.0–100.0)
MONOS PCT: 10 % (ref 3–12)
MPV: 10.7 fL (ref 8.6–12.4)
Monocytes Absolute: 0.8 10*3/uL (ref 0.1–1.0)
NEUTROS PCT: 55 % (ref 43–77)
Neutro Abs: 4.4 10*3/uL (ref 1.7–7.7)
Platelets: 283 10*3/uL (ref 150–400)
RBC: 4.51 MIL/uL (ref 3.87–5.11)
RDW: 13.8 % (ref 11.5–15.5)
WBC: 8 10*3/uL (ref 4.0–10.5)

## 2014-08-20 NOTE — Patient Instructions (Signed)

## 2014-08-20 NOTE — Progress Notes (Addendum)
Rafael Bihariorma [place] 04-23-1971 [id number]    History:    Presents for annual exam.  Monthly cycle until December, skip January, February light cycle. Vasectomy. Normal Pap and mammogram history. Significant history 2 sisters with uterine cancer both hysterectomy one sister living, oldest sister died with breast cancer with metastasis to the lung at age 42. 2012 negative endometrial biopsy  Past medical history, past surgical history, family history and social history were all reviewed and documented in the EPIC chart. 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 3 sons 22, 18, 14 all doing well. Father hypertension and diabetes. Mother hysterectomy age 42 for menorrhagia and has osteoporosis.  ROS:  A ROS was performed and pertinent positives and negatives are included.  Exam:  Filed Vitals:   08/20/14 1541  BP: 120/70    General appearance:  Normal Thyroid:  Symmetrical, normal in size, without palpable masses or nodularity. Respiratory  Auscultation:  Clear without wheezing or rhonchi Cardiovascular  Auscultation:  Regular rate, without rubs, murmurs or gallops  Edema/varicosities:  Not grossly evident Abdominal  Soft,nontender, without masses, guarding or rebound.  Liver/spleen:  No organomegaly noted  Hernia:  None appreciated  Skin  Inspection:  Grossly normal   Breasts: Examined lying and sitting.     Right: Without masses, retractions, discharge or axillary adenopathy.     Left: Without masses, retractions, discharge or axillary adenopathy. Gentitourinary   Inguinal/mons:  Normal without inguinal adenopathy  External genitalia:  Normal  BUS/Urethra/Skene's glands:  Normal  Vagina:  Normal  Cervix:  Normal  Uterus:   normal in size, shape and contour.  Midline and mobile  Adnexa/parametria:     Rt: Without masses or tenderness.   Lt: Without masses or tenderness.  Anus and perineum: Normal  Digital rectal exam: Normal sphincter tone without palpated masses or  tenderness  Assessment/Plan:  44 y.o.MHF G3P3  for annual exam.    Missed cycle, lighter than average cycle February with menopausal symptoms Vasectomy  Plan: SBE's, continue annual screening mammogram. Regular exercise, calcium rich diet, vitamin D 1000 daily encouraged. CBC, lipid panel, TSH, FSH, CMP, UA, Pap with HR HPV typing. New screening guidelines reviewed. Ultrasound after next cycle to check endometrial thickness.  Telephone Call to patient after review with Dr. Lily PeerFernandez. Will keep scheduled ultrasound appointment after next cycle. Again declines Mirena IUD but will take Prometrium 200 mg day one through 12 if no cycle. Reviewed not menopausal, important to shed lining/have cycles monthly.  Harrington ChallengerYOUNG,Glyndon Tursi J [facility] LLCWHNP, 4:59 PM 08/20/2014

## 2014-08-21 LAB — URINALYSIS W MICROSCOPIC + REFLEX CULTURE
BILIRUBIN URINE: NEGATIVE
CASTS: NONE SEEN
Crystals: NONE SEEN
Glucose, UA: NEGATIVE mg/dL
Leukocytes, UA: NEGATIVE
Nitrite: NEGATIVE
PH: 5 (ref 5.0–8.0)
Protein, ur: NEGATIVE mg/dL
Specific Gravity, Urine: 1.027 (ref 1.005–1.030)
UROBILINOGEN UA: 0.2 mg/dL (ref 0.0–1.0)

## 2014-08-21 LAB — FOLLICLE STIMULATING HORMONE: FSH: 19.5 m[IU]/mL

## 2014-08-21 LAB — TSH: TSH: 1.388 u[IU]/mL (ref 0.350–4.500)

## 2014-08-23 ENCOUNTER — Other Ambulatory Visit (HOSPITAL_COMMUNITY)
Admission: RE | Admit: 2014-08-23 | Discharge: 2014-08-23 | Disposition: A | Discharge status: Home / Self Care | Payer: BLUE CROSS/BLUE SHIELD | Source: Ambulatory Visit | Attending: Women's Health | Admitting: Women's Health

## 2014-08-23 DIAGNOSIS — Z1151 Encounter for screening for human papillomavirus (HPV): Secondary | ICD-10-CM | POA: Diagnosis present

## 2014-08-23 DIAGNOSIS — Z01419 Encounter for gynecological examination (general) (routine) without abnormal findings: Secondary | ICD-10-CM | POA: Diagnosis not present

## 2014-08-23 LAB — URINE CULTURE: Colony Count: >=100000

## 2014-08-23 MED ORDER — PROGESTERONE MICRONIZED 200 MG PO CAPS: ORAL_CAPSULE | ORAL | Status: DC

## 2014-08-23 NOTE — Addendum Note (Signed)
Addended by: Harrington ChallengerYOUNG, Zoe Goonan J on: 08/23/2014 08:27 AM   Modules accepted: Orders

## 2014-08-23 NOTE — Addendum Note (Signed)
Addended by: Dayna BarkerGARDNER, Daryl Beehler K on: 08/23/2014 09:14 AM   Modules accepted: Orders

## 2014-08-25 LAB — CYTOLOGY - PAP

## 2014-09-03 ENCOUNTER — Other Ambulatory Visit: Payer: Self-pay | Admitting: Women's Health

## 2014-09-03 MED ORDER — SULFAMETHOXAZOLE-TRIMETHOPRIM 800-160 MG PO TABS
1.0000 | ORAL_TABLET | Freq: Two times a day (BID) | ORAL | Status: DC
Start: 2014-09-03 — End: 2019-12-15

## 2014-10-29 ENCOUNTER — Other Ambulatory Visit: Payer: Self-pay | Admitting: *Deleted

## 2014-10-29 ENCOUNTER — Other Ambulatory Visit: Payer: BLUE CROSS/BLUE SHIELD

## 2014-10-29 DIAGNOSIS — R8271 Bacteriuria: Secondary | ICD-10-CM

## 2014-11-01 LAB — URINALYSIS W MICROSCOPIC + REFLEX CULTURE
Bilirubin Urine: NEGATIVE
Glucose, UA: NEGATIVE mg/dL
HGB URINE DIPSTICK: NEGATIVE
Ketones, ur: NEGATIVE mg/dL
LEUKOCYTES UA: NEGATIVE
NITRITE: NEGATIVE
PROTEIN: NEGATIVE mg/dL
Specific Gravity, Urine: <1.005 — ABNORMAL LOW (ref 1.005–1.030)
UROBILINOGEN UA: 0.2 mg/dL (ref 0.0–1.0)
pH: 7 (ref 5.0–8.0)

## 2015-04-06 ENCOUNTER — Other Ambulatory Visit: Payer: Self-pay

## 2015-04-06 DIAGNOSIS — Z1231 Encounter for screening mammogram for malignant neoplasm of breast: Secondary | ICD-10-CM

## 2015-05-03 ENCOUNTER — Ambulatory Visit
Admission: RE | Admit: 2015-05-03 | Discharge: 2015-05-03 | Disposition: A | Discharge status: Home / Self Care | Payer: BLUE CROSS/BLUE SHIELD | Source: Ambulatory Visit

## 2015-05-03 DIAGNOSIS — Z1231 Encounter for screening mammogram for malignant neoplasm of breast: Secondary | ICD-10-CM

## 2015-08-23 ENCOUNTER — Ambulatory Visit (INDEPENDENT_AMBULATORY_CARE_PROVIDER_SITE_OTHER): Payer: BLUE CROSS/BLUE SHIELD | Admitting: Women's Health

## 2015-08-23 ENCOUNTER — Encounter: Payer: Self-pay | Admitting: Women's Health

## 2015-08-23 VITALS — BP 118/76 | Ht 61.0 in | Wt 130.0 lb

## 2015-08-23 DIAGNOSIS — Z01419 Encounter for gynecological examination (general) (routine) without abnormal findings: Secondary | ICD-10-CM

## 2015-08-23 DIAGNOSIS — Z13220 Encounter for screening for lipoid disorders: Secondary | ICD-10-CM | POA: Diagnosis not present

## 2015-08-23 DIAGNOSIS — Z833 Family history of diabetes mellitus: Secondary | ICD-10-CM

## 2015-08-23 NOTE — Progress Notes (Signed)
Christine Mckinney 05-25-1971 [id number]    History:    Presents for annual exam.  Regular monthly cycles/vasectomy.  History of gestational diabetes with 2 youngest sons, did not require insulin. Sister died from breast cancer with metastasis to lung and [place], 2 sisters with cervical cancer. Normal Pap and mammogram history.,    Past medical history, past surgical history, family history and social history were all reviewed and documented in the EPIC chart. Works at [employer]. Eats healthy and exercises regularly. 3 sons- ages 5223, 3319, 6115- all doing well. Good relationship with husband. Father history of diabetes and hypertension. Mother osteoporosis.  ROS:  A ROS was performed and pertinent positives and negatives are included.  Exam:  Filed Vitals:   08/23/15 1557  BP: 118/76    General appearance:  Normal Thyroid:  Symmetrical, normal in size, without palpable masses or nodularity. Respiratory  Auscultation:  Clear without wheezing or rhonchi Cardiovascular  Auscultation:  Regular rate, without rubs, murmurs or gallops  Edema/varicosities:  Not grossly evident Abdominal  Soft,nontender, without masses, guarding or rebound.  Liver/spleen:  No organomegaly noted  Hernia:  None appreciated  Skin  Inspection:  Grossly normal   Breasts: Examined lying and sitting.     Right: Without masses, retractions, discharge or axillary adenopathy.     Left: Without masses, retractions, discharge or axillary adenopathy. Gentitourinary   Inguinal/mons:  Normal without inguinal adenopathy  External genitalia:  Normal  BUS/Urethra/Skene's glands:  Normal  Vagina:  Normal  Cervix:  Normal  Uterus:   Normal in size, shape and contour.  Midline and mobile  Adnexa/parametria:     Rt: Without masses or tenderness.   Lt: Without masses or tenderness.  Anus and perineum: Normal  Digital rectal exam: Normal sphincter tone without palpated masses or tenderness  Assessment/Plan:  45  y.o.  MHF G3P3 for annual exam presents without complaints.  Regular monthly cycles/vasectomy History of gestational diabetes x2  Plan: SBE's, continue annual 3-D screening mammogram history of dense breasts. Discussed healthy eating and exercise. Follow-up as needed or for annual. CBC, lipid panel, UA, HbA1c. Pap normal with negative HR HPV 2016, new screening guidelines reviewed.  Harrington ChallengerYOUNG,Christine Alia J [facility]WHNP, 4:50 PM 08/23/2015

## 2015-08-23 NOTE — Patient Instructions (Signed)
Health Maintenance, Female Adopting a healthy lifestyle and getting preventive care can go a long way to promote health and wellness. Talk with your health care provider about what schedule of regular examinations is right for you. This is a good chance for you to check in with your provider about disease prevention and staying healthy. In between checkups, there are plenty of things you can do on your own. Experts have done a lot of research about which lifestyle changes and preventive measures are most likely to keep you healthy. Ask your health care provider for more information. WEIGHT AND DIET  Eat a healthy diet  Be sure to include plenty of vegetables, fruits, low-fat dairy products, and lean protein.  Do not eat a lot of foods high in solid fats, added sugars, or salt.  Get regular exercise. This is one of the most important things you can do for your health.  Most adults should exercise for at least 150 minutes each week. The exercise should increase your heart rate and make you sweat (moderate-intensity exercise).  Most adults should also do strengthening exercises at least twice a week. This is in addition to the moderate-intensity exercise.  Maintain a healthy weight  Body mass index (BMI) is a measurement that can be used to identify possible weight problems. It estimates body fat based on height and weight. Your health care provider can help determine your BMI and help you achieve or maintain a healthy weight.  For females 20 years of age and older:   A BMI below 18.5 is considered underweight.  A BMI of 18.5 to 24.9 is normal.  A BMI of 25 to 29.9 is considered overweight.  A BMI of 30 and above is considered obese.  Watch levels of cholesterol and blood lipids  You should start having your blood tested for lipids and cholesterol at 43 years of age, then have this test every 5 years.  You may need to have your cholesterol levels checked more often if:  Your lipid  or cholesterol levels are high.  You are older than 43 years of age.  You are at high risk for heart disease.  CANCER SCREENING   Lung Cancer  Lung cancer screening is recommended for adults 55-80 years old who are at high risk for lung cancer because of a history of smoking.  A yearly low-dose CT scan of the lungs is recommended for people who:  Currently smoke.  Have quit within the past 15 years.  Have at least a 30-pack-year history of smoking. A pack year is smoking an average of one pack of cigarettes a day for 1 year.  Yearly screening should continue until it has been 15 years since you quit.  Yearly screening should stop if you develop a health problem that would prevent you from having lung cancer treatment.  Breast Cancer  Practice breast self-awareness. This means understanding how your breasts normally appear and feel.  It also means doing regular breast self-exams. Let your health care provider know about any changes, no matter how small.  If you are in your 20s or 30s, you should have a clinical breast exam (CBE) by a health care provider every 1-3 years as part of a regular health exam.  If you are 40 or older, have a CBE every year. Also consider having a breast X-ray (mammogram) every year.  If you have a family history of breast cancer, talk to your health care provider about genetic screening.  If you   are at high risk for breast cancer, talk to your health care provider about having an MRI and a mammogram every year.  Breast cancer gene (BRCA) assessment is recommended for women who have family members with BRCA-related cancers. BRCA-related cancers include:  Breast.  Ovarian.  Tubal.  Peritoneal cancers.  Results of the assessment will determine the need for genetic counseling and BRCA1 and BRCA2 testing. Cervical Cancer Your health care provider may recommend that you be screened regularly for cancer of the pelvic organs (ovaries, uterus, and  vagina). This screening involves a pelvic examination, including checking for microscopic changes to the surface of your cervix (Pap test). You may be encouraged to have this screening done every 3 years, beginning at age 21.  For women ages 30-65, health care providers may recommend pelvic exams and Pap testing every 3 years, or they may recommend the Pap and pelvic exam, combined with testing for human papilloma virus (HPV), every 5 years. Some types of HPV increase your risk of cervical cancer. Testing for HPV may also be done on women of any age with unclear Pap test results.  Other health care providers may not recommend any screening for nonpregnant women who are considered low risk for pelvic cancer and who do not have symptoms. Ask your health care provider if a screening pelvic exam is right for you.  If you have had past treatment for cervical cancer or a condition that could lead to cancer, you need Pap tests and screening for cancer for at least 20 years after your treatment. If Pap tests have been discontinued, your risk factors (such as having a new sexual partner) need to be reassessed to determine if screening should resume. Some women have medical problems that increase the chance of getting cervical cancer. In these cases, your health care provider may recommend more frequent screening and Pap tests. Colorectal Cancer  This type of cancer can be detected and often prevented.  Routine colorectal cancer screening usually begins at 43 years of age and continues through 43 years of age.  Your health care provider may recommend screening at an earlier age if you have risk factors for colon cancer.  Your health care provider may also recommend using home test kits to check for hidden blood in the stool.  A small camera at the end of a tube can be used to examine your colon directly (sigmoidoscopy or colonoscopy). This is done to check for the earliest forms of colorectal  cancer.  Routine screening usually begins at age 50.  Direct examination of the colon should be repeated every 5-10 years through 43 years of age. However, you may need to be screened more often if early forms of precancerous polyps or small growths are found. Skin Cancer  Check your skin from head to toe regularly.  Tell your health care provider about any new moles or changes in moles, especially if there is a change in a mole's shape or color.  Also tell your health care provider if you have a mole that is larger than the size of a pencil eraser.  Always use sunscreen. Apply sunscreen liberally and repeatedly throughout the day.  Protect yourself by wearing long sleeves, pants, a wide-brimmed hat, and sunglasses whenever you are outside. HEART DISEASE, DIABETES, AND HIGH BLOOD PRESSURE   High blood pressure causes heart disease and increases the risk of stroke. High blood pressure is more likely to develop in:  People who have blood pressure in the high end   of the normal range (130-139/85-89 mm Hg).  People who are overweight or obese.  People who are African American.  If you are 38-23 years of age, have your blood pressure checked every 3-5 years. If you are 61 years of age or older, have your blood pressure checked every year. You should have your blood pressure measured twice--once when you are at a hospital or clinic, and once when you are not at a hospital or clinic. Record the average of the two measurements. To check your blood pressure when you are not at a hospital or clinic, you can use:  An automated blood pressure machine at a pharmacy.  A home blood pressure monitor.  If you are between 45 years and 39 years old, ask your health care provider if you should take aspirin to prevent strokes.  Have regular diabetes screenings. This involves taking a blood sample to check your fasting blood sugar level.  If you are at a normal weight and have a low risk for diabetes,  have this test once every three years after 43 years of age.  If you are overweight and have a high risk for diabetes, consider being tested at a younger age or more often. PREVENTING INFECTION  Hepatitis B  If you have a higher risk for hepatitis B, you should be screened for this virus. You are considered at high risk for hepatitis B if:  You were born in a country where hepatitis B is common. Ask your health care provider which countries are considered high risk.  Your parents were born in a high-risk country, and you have not been immunized against hepatitis B (hepatitis B vaccine).  You have HIV or AIDS.  You use needles to inject street drugs.  You live with someone who has hepatitis B.  You have had sex with someone who has hepatitis B.  You get hemodialysis treatment.  You take certain medicines for conditions, including cancer, organ transplantation, and autoimmune conditions. Hepatitis C  Blood testing is recommended for:  Everyone born from [birth year] through 1963.  Anyone with known risk factors for hepatitis C. Sexually transmitted infections (STIs)  You should be screened for sexually transmitted infections (STIs) including gonorrhea and chlamydia if:  You are sexually active and are younger than 43 years of age.  You are older than 43 years of age and your health care provider tells you that you are at risk for this type of infection.  Your sexual activity has changed since you were last screened and you are at an increased risk for chlamydia or gonorrhea. Ask your health care provider if you are at risk.  If you do not have HIV, but are at risk, it may be recommended that you take a prescription medicine daily to prevent HIV infection. This is called pre-exposure prophylaxis (PrEP). You are considered at risk if:  You are sexually active and do not regularly use condoms or know the HIV status of your partner(s).  You take drugs by injection.  You are sexually  active with a partner who has HIV. Talk with your health care provider about whether you are at high risk of being infected with HIV. If you choose to begin PrEP, you should first be tested for HIV. You should then be tested every 3 months for as long as you are taking PrEP.  PREGNANCY   If you are premenopausal and you may become pregnant, ask your health care provider about preconception counseling.  If you may  become pregnant, take 400 to 800 micrograms (mcg) of folic acid every day.  If you want to prevent pregnancy, talk to your health care provider about birth control (contraception). OSTEOPOROSIS AND MENOPAUSE   Osteoporosis is a disease in which the bones lose minerals and strength with aging. This can result in serious bone fractures. Your risk for osteoporosis can be identified using a bone density scan.  If you are 61 years of age or older, or if you are at risk for osteoporosis and fractures, ask your health care provider if you should be screened.  Ask your health care provider whether you should take a calcium or vitamin D supplement to lower your risk for osteoporosis.  Menopause may have certain physical symptoms and risks.  Hormone replacement therapy may reduce some of these symptoms and risks. Talk to your health care provider about whether hormone replacement therapy is right for you.  HOME CARE INSTRUCTIONS   Schedule regular health, dental, and eye exams.  Stay current with your immunizations.   Do not use any tobacco products including cigarettes, chewing tobacco, or electronic cigarettes.  If you are pregnant, do not drink alcohol.  If you are breastfeeding, limit how much and how often you drink alcohol.  Limit alcohol intake to no more than 1 drink per day for nonpregnant women. One drink equals 12 ounces of beer, 5 ounces of wine, or 1 ounces of hard liquor.  Do not use street drugs.  Do not share needles.  Ask your health care provider for help if  you need support or information about quitting drugs.  Tell your health care provider if you often feel depressed.  Tell your health care provider if you have ever been abused or do not feel safe at home.   This information is not intended to replace advice given to you by your health care provider. Make sure you discuss any questions you have with your health care provider.   Document Released: 12/18/2010 Document Revised: 06/25/2014 Document Reviewed: 05/06/2013 Elsevier Interactive Patient Education [employer].

## 2015-08-24 LAB — CBC WITH DIFFERENTIAL/PLATELET
BASOS PCT: 0 % (ref 0–1)
Basophils Absolute: 0 10*3/uL (ref 0.0–0.1)
EOS PCT: 2 % (ref 0–5)
Eosinophils Absolute: 0.2 10*3/uL (ref 0.0–0.7)
HCT: 38.9 % (ref 36.0–46.0)
Hemoglobin: 13.6 g/dL (ref 12.0–15.0)
Lymphocytes Relative: 32 % (ref 12–46)
Lymphs Abs: 3 10*3/uL (ref 0.7–4.0)
MCH: 30 pg (ref 26.0–34.0)
MCHC: 35 g/dL (ref 30.0–36.0)
MCV: 85.7 fL (ref 78.0–100.0)
MPV: 10.4 fL (ref 8.6–12.4)
Monocytes Absolute: 0.8 10*3/uL (ref 0.1–1.0)
Monocytes Relative: 8 % (ref 3–12)
NEUTROS PCT: 58 % (ref 43–77)
Neutro Abs: 5.5 10*3/uL (ref 1.7–7.7)
PLATELETS: 300 10*3/uL (ref 150–400)
RBC: 4.54 MIL/uL (ref 3.87–5.11)
RDW: 13.6 % (ref 11.5–15.5)
WBC: 9.5 10*3/uL (ref 4.0–10.5)

## 2015-08-24 LAB — LIPID PANEL
CHOL/HDL RATIO: 2.9 ratio (ref <=5.0)
CHOLESTEROL: 136 mg/dL (ref 125–200)
HDL: 47 mg/dL (ref >=46)
LDL Cholesterol: 55 mg/dL (ref <130)
Triglycerides: 171 mg/dL — ABNORMAL HIGH (ref <150)
VLDL: 34 mg/dL — ABNORMAL HIGH (ref <30)

## 2015-08-24 LAB — HEMOGLOBIN A1C
Hgb A1c MFr Bld: 5.2 % (ref <5.7)
MEAN PLASMA GLUCOSE: 103 mg/dL (ref <117)

## 2016-07-13 ENCOUNTER — Other Ambulatory Visit: Payer: Self-pay | Admitting: Gynecology

## 2016-07-13 DIAGNOSIS — Z1231 Encounter for screening mammogram for malignant neoplasm of breast: Secondary | ICD-10-CM

## 2016-08-06 ENCOUNTER — Ambulatory Visit
Admission: RE | Admit: 2016-08-06 | Discharge: 2016-08-06 | Disposition: A | Discharge status: Home / Self Care | Payer: BLUE CROSS/BLUE SHIELD | Source: Ambulatory Visit | Attending: Gynecology | Admitting: Gynecology

## 2016-08-06 DIAGNOSIS — Z1231 Encounter for screening mammogram for malignant neoplasm of breast: Secondary | ICD-10-CM

## 2016-08-23 ENCOUNTER — Encounter: Payer: Self-pay | Admitting: Women's Health

## 2016-08-23 ENCOUNTER — Ambulatory Visit (INDEPENDENT_AMBULATORY_CARE_PROVIDER_SITE_OTHER): Payer: BLUE CROSS/BLUE SHIELD | Admitting: Women's Health

## 2016-08-23 VITALS — BP 120/76 | Ht 60.5 in | Wt 130.0 lb

## 2016-08-23 DIAGNOSIS — Z13220 Encounter for screening for lipoid disorders: Secondary | ICD-10-CM

## 2016-08-23 DIAGNOSIS — Z79890 Hormone replacement therapy (postmenopausal): Secondary | ICD-10-CM | POA: Diagnosis not present

## 2016-08-23 DIAGNOSIS — Z01419 Encounter for gynecological examination (general) (routine) without abnormal findings: Secondary | ICD-10-CM | POA: Diagnosis not present

## 2016-08-23 DIAGNOSIS — N912 Amenorrhea, unspecified: Secondary | ICD-10-CM | POA: Diagnosis not present

## 2016-08-23 LAB — COMPREHENSIVE METABOLIC PANEL
ALBUMIN: 4.5 g/dL (ref 3.6–5.1)
ALK PHOS: 73 U/L (ref 33–115)
ALT: 13 U/L (ref 6–29)
AST: 14 U/L (ref 10–30)
BUN: 12 mg/dL (ref 7–25)
CO2: 25 mmol/L (ref 20–31)
Calcium: 9.7 mg/dL (ref 8.6–10.2)
Chloride: 104 mmol/L (ref 98–110)
Creat: 0.77 mg/dL (ref 0.50–1.10)
Glucose, Bld: 83 mg/dL (ref 65–99)
POTASSIUM: 3.8 mmol/L (ref 3.5–5.3)
SODIUM: 139 mmol/L (ref 135–146)
TOTAL PROTEIN: 7.2 g/dL (ref 6.1–8.1)
Total Bilirubin: 0.5 mg/dL (ref 0.2–1.2)

## 2016-08-23 LAB — CBC WITH DIFFERENTIAL/PLATELET
BASOS ABS: 85 {cells}/uL (ref 0–200)
Basophils Relative: 1 %
EOS ABS: 85 {cells}/uL (ref 15–500)
Eosinophils Relative: 1 %
HCT: 39.8 % (ref 35.0–45.0)
HEMOGLOBIN: 14.2 g/dL (ref 11.7–15.5)
LYMPHS ABS: 3400 {cells}/uL (ref 850–3900)
Lymphocytes Relative: 40 %
MCH: 30.5 pg (ref 27.0–33.0)
MCHC: 35.7 g/dL (ref 32.0–36.0)
MCV: 85.6 fL (ref 80.0–100.0)
MONOS PCT: 8 %
MPV: 10.5 fL (ref 7.5–12.5)
Monocytes Absolute: 680 cells/uL (ref 200–950)
NEUTROS ABS: 4250 {cells}/uL (ref 1500–7800)
Neutrophils Relative %: 50 %
PLATELETS: 312 10*3/uL (ref 140–400)
RBC: 4.65 MIL/uL (ref 3.80–5.10)
RDW: 13.9 % (ref 11.0–15.0)
WBC: 8.5 10*3/uL (ref 3.8–10.8)

## 2016-08-23 LAB — LIPID PANEL
CHOLESTEROL: 133 mg/dL (ref <200)
HDL: 55 mg/dL (ref >50)
LDL Cholesterol: 50 mg/dL (ref <100)
TRIGLYCERIDES: 140 mg/dL (ref <150)
Total CHOL/HDL Ratio: 2.4 Ratio (ref <5.0)
VLDL: 28 mg/dL (ref <30)

## 2016-08-23 MED ORDER — ESTRADIOL 0.05 MG/24HR TD PTTW: 1.0000 | MEDICATED_PATCH | TRANSDERMAL | 12 refills | Status: DC

## 2016-08-23 MED ORDER — PROGESTERONE MICRONIZED 200 MG PO CAPS: 200.0000 mg | ORAL_CAPSULE | Freq: Every day | ORAL | 4 refills | Status: DC

## 2016-08-23 NOTE — Progress Notes (Signed)
Rafael Bihariorma Hoog 14-Mar-1971 [id number]    History:    Presents for annual exam.  Regular monthly cycle until October, amenorrheic since. Having numerous menopausal symptoms of hot flashes, poor sleep, mood changes, vaginal dryness, and generally not feeling well. History of GDM. Normal Pap and mammogram history.  Active lifestyle but recently has not felt like exercising.  Past medical history, past surgical history, family history and social history were all reviewed and documented in the EPIC chart. Works at [employer]. Has 3 sons ages 7816, 9020 and 2224 all doing well. Father diabetes and hypertension. Mother osteoporosis.  ROS:  A ROS was performed and pertinent positives and negatives are included.  Exam:  Vitals:   08/23/16 1528  BP: 120/76  Weight: 130 lb (59 kg)  Height: 5' 0.5" (1.537 m)   Body mass index is 24.97 kg/m.   General appearance:  Normal Thyroid:  Symmetrical, normal in size, without palpable masses or nodularity. Respiratory  Auscultation:  Clear without wheezing or rhonchi Cardiovascular  Auscultation:  Regular rate, without rubs, murmurs or gallops  Edema/varicosities:  Not grossly evident Abdominal  Soft,nontender, without masses, guarding or rebound.  Liver/spleen:  No organomegaly noted  Hernia:  None appreciated  Skin  Inspection:  Grossly normal   Breasts: Examined lying and sitting.     Right: Without masses, retractions, discharge or axillary adenopathy.     Left: Without masses, retractions, discharge or axillary adenopathy. Gentitourinary   Inguinal/mons:  Normal without inguinal adenopathy  External genitalia:  Normal  BUS/Urethra/Skene's glands:  Normal  Vagina:  Normal  Cervix:  Normal  Uterus:  normal in size, shape and contour.  Midline and mobile  Adnexa/parametria:     Rt: Without masses or tenderness.   Lt: Without masses or tenderness.  Anus and perineum: Normal  Digital rectal exam: Normal sphincter tone without palpated  masses or tenderness  Assessment/Plan:  46 y.o. M HF G3 P3  for annual exam.    Menopausal symptoms/LMP October 2017/vasectomy  Plan: Menopause reviewed, reviewed most of her symptoms are menopausal in nature, options reviewed, will check FSH. HRT reviewed, would like to try, Vivelle Dot 0.05 patch twice weekly and Prometrium 200 mg at bedtime day 1 through 12 of each month. Prescriptions for both given and reviewed slight risk for blood clots, strokes and breast cancer. Instructed to call if no relief of symptoms. Reviewed may get a withdrawal bleed after the Prometrium. SBE's, continue annual 3-D screening mammogram history of dense breasts. Exercise, calcium rich diet, vitamin D 2000 daily encouraged. CBC, FSH, lipid panel, CMP, UA, Pap normal with negative HR HPV 2016, new screening guidelines reviewed.    Harrington ChallengerYOUNG,Israel Wunder [facility] Surgical HospitalWHNP, 3:57 PM 08/23/2016

## 2016-08-23 NOTE — Patient Instructions (Signed)
Menopause and Hormone Replacement Therapy What is hormone replacement therapy? Hormone replacement therapy (HRT) is the use of artificial (synthetic) hormones to replace hormones that your body stops producing during menopause. Menopause is the normal time of life when menstrual periods stop completely and the ovaries stop producing the female hormones estrogen and progesterone. This lack of hormones can affect your health and cause undesirable symptoms. HRT can relieve some of those symptoms. What are my options for HRT? HRT may consist of the synthetic hormones estrogen and progestin, or it may consist of only estrogen (estrogen-only therapy). You and your health care provider will decide which form of HRT is best for you. If you choose to be on HRT and you have a uterus, estrogen and progestin are usually prescribed. Estrogen-only therapy is used for women who do not have a uterus. Possible options for taking HRT include:  Pills.  Patches.  Gels.  Sprays.  Vaginal cream.  Vaginal rings.  Vaginal inserts. The amount of hormone(s) that you take and how long you take the hormone(s) varies depending on your individual health. It is important to:  Begin HRT with the lowest possible dosage.  Stop HRT as soon as your health care provider tells you to stop.  Work with your health care provider so that you feel informed and comfortable with your decisions. What are the benefits of HRT? HRT can reduce the frequency and severity of menopausal symptoms. Benefits of HRT vary depending on the menopausal symptoms that you have, the severity of your symptoms, and your overall health. HRT may help to improve the following menopausal symptoms:  Hot flashes and night sweats. These are sudden feelings of heat that spread over the face and body. The skin may turn red, like a blush. Night sweats are hot flashes that happen while you are sleeping or trying to sleep.  Bone loss (osteoporosis). The body  loses calcium more quickly after menopause, causing the bones to become weaker. This can increase the risk for bone breaks (fractures).  Vaginal dryness. The lining of the vagina can become thin and dry, which can cause pain during sexual intercourse or cause infection, burning, or itching.  Urinary tract infections.  Urinary incontinence. This is a decreased ability to control when you urinate.  Irritability.  Short-term memory problems. What are the risks of HRT? Risks of HRT vary depending on your individual health and medical history. Risks of HRT also depend on whether you receive both estrogen and progestin or you receive estrogen only.HRT may increase the risk of:  Spotting. This is when a small amount of bloodleaks from the vagina unexpectedly.  Endometrial cancer. This cancer is in the lining of the uterus (endometrium).  Breast cancer.  Increased density of breast tissue. This can make it harder to find breast cancer on a breast X-ray (mammogram).  Stroke.  Heart attack.  Blood clots.  Gallbladder disease. Risks of HRT can increase if you have any of the following conditions:  Endometrial cancer.  Liver disease.  Heart disease.  Breast cancer.  History of blood clots.  History of stroke. How should I care for myself while I am on HRT?  Take over-the-counter and prescription medicines only as told by your health care provider.  Get mammograms, pelvic exams, and medical checkups as often as told by your health care provider.  Have Pap tests done as often as told by your health care provider. A Pap test is sometimes called a Pap smear. It is a screening   test that is used to check for signs of cancer of the cervix and vagina. A Pap test can also identify the presence of infection or precancerous changes. Pap tests may be done:  Every 3 years, starting at age 21.  Every 5 years, starting after age 30, in combination with testing for human papillomavirus  (HPV).  More often or less often depending on other medical conditions you have, your age, and other risk factors.  It is your responsibility to get your Pap test results. Ask your health care provider or the department performing the test when your results will be ready.  Keep all follow-up visits as told by your health care provider. This is important. When should I seek medical care? Talk with your health care provider if:  You have any of these:  Pain or swelling in your legs.  Shortness of breath.  Chest pain.  Lumps or changes in your breasts or armpits.  Slurred speech.  Pain, burning, or bleeding when you urine.  You develop any of these:  Unusual vaginal bleeding.  Dizziness or headaches.  Weakness or numbness in any part of your arms or legs.  Pain in your abdomen. This information is not intended to replace advice given to you by your health care provider. Make sure you discuss any questions you have with your health care provider. Document Released: 03/03/2003 Document Revised: 05/01/2016 Document Reviewed: 12/06/2014 Elsevier Interactive Patient Education  2017 [employer] Health Maintenance for Postmenopausal Women Menopause is a normal process in which your reproductive ability comes to an end. This process happens gradually over a span of months to years, usually between the ages of 48 and 55. Menopause is complete when you have missed 12 consecutive menstrual periods. It is important to talk with your health care provider about some of the most common conditions that affect postmenopausal women, such as heart disease, cancer, and bone loss (osteoporosis). Adopting a healthy lifestyle and getting preventive care can help to promote your health and wellness. Those actions can also lower your chances of developing some of these common conditions. What should I know about menopause? During menopause, you may experience a number of symptoms, such  as:  Moderate-to-severe hot flashes.  Night sweats.  Decrease in sex drive.  Mood swings.  Headaches.  Tiredness.  Irritability.  Memory problems.  Insomnia. Choosing to treat or not to treat menopausal changes is an individual decision that you make with your health care provider. What should I know about hormone replacement therapy and supplements? Hormone therapy products are effective for treating symptoms that are associated with menopause, such as hot flashes and night sweats. Hormone replacement carries certain risks, especially as you become older. If you are thinking about using estrogen or estrogen with progestin treatments, discuss the benefits and risks with your health care provider. What should I know about heart disease and stroke? Heart disease, heart attack, and stroke become more likely as you age. This may be due, in part, to the hormonal changes that your body experiences during menopause. These can affect how your body processes dietary fats, triglycerides, and cholesterol. Heart attack and stroke are both medical emergencies. There are many things that you can do to help prevent heart disease and stroke:  Have your blood pressure checked at least every 1-2 years. High blood pressure causes heart disease and increases the risk of stroke.  If you are 55-79 years old, ask your health care provider if you should take aspirin to prevent   a heart attack or a stroke.  Do not use any tobacco products, including cigarettes, chewing tobacco, or electronic cigarettes. If you need help quitting, ask your health care provider.  It is important to eat a healthy diet and maintain a healthy weight.  Be sure to include plenty of vegetables, fruits, low-fat dairy products, and lean protein.  Avoid eating foods that are high in solid fats, added sugars, or salt (sodium).  Get regular exercise. This is one of the most important things that you can do for your health.  Try to  exercise for at least 150 minutes each week. The type of exercise that you do should increase your heart rate and make you sweat. This is known as moderate-intensity exercise.  Try to do strengthening exercises at least twice each week. Do these in addition to the moderate-intensity exercise.  Know your numbers.Ask your health care provider to check your cholesterol and your blood glucose. Continue to have your blood tested as directed by your health care provider. What should I know about cancer screening? There are several types of cancer. Take the following steps to reduce your risk and to catch any cancer development as early as possible. Breast Cancer  Practice breast self-awareness.  This means understanding how your breasts normally appear and feel.  It also means doing regular breast self-exams. Let your health care provider know about any changes, no matter how small.  If you are 40 or older, have a clinician do a breast exam (clinical breast exam or CBE) every year. Depending on your age, family history, and medical history, it may be recommended that you also have a yearly breast X-ray (mammogram).  If you have a family history of breast cancer, talk with your health care provider about genetic screening.  If you are at high risk for breast cancer, talk with your health care provider about having an MRI and a mammogram every year.  Breast cancer (BRCA) gene test is recommended for women who have family members with BRCA-related cancers. Results of the assessment will determine the need for genetic counseling and BRCA1 and for BRCA2 testing. BRCA-related cancers include these types:  Breast. This occurs in males or females.  Ovarian.  Tubal. This may also be called fallopian tube cancer.  Cancer of the abdominal or pelvic lining (peritoneal cancer).  Prostate.  Pancreatic. Cervical, Uterine, and Ovarian Cancer  Your health care provider may recommend that you be screened  regularly for cancer of the pelvic organs. These include your ovaries, uterus, and vagina. This screening involves a pelvic exam, which includes checking for microscopic changes to the surface of your cervix (Pap test).  For women ages 21-65, health care providers may recommend a pelvic exam and a Pap test every three years. For women ages 30-65, they may recommend the Pap test and pelvic exam, combined with testing for human papilloma virus (HPV), every five years. Some types of HPV increase your risk of cervical cancer. Testing for HPV may also be done on women of any age who have unclear Pap test results.  Other health care providers may not recommend any screening for nonpregnant women who are considered low risk for pelvic cancer and have no symptoms. Ask your health care provider if a screening pelvic exam is right for you.  If you have had past treatment for cervical cancer or a condition that could lead to cancer, you need Pap tests and screening for cancer for at least 20 years after your treatment.   If Pap tests have been discontinued for you, your risk factors (such as having a new sexual partner) need to be reassessed to determine if you should start having screenings again. Some women have medical problems that increase the chance of getting cervical cancer. In these cases, your health care provider may recommend that you have screening and Pap tests more often.  If you have a family history of uterine cancer or ovarian cancer, talk with your health care provider about genetic screening.  If you have vaginal bleeding after reaching menopause, tell your health care provider.  There are currently no reliable tests available to screen for ovarian cancer. Lung Cancer  Lung cancer screening is recommended for adults 55-80 years old who are at high risk for lung cancer because of a history of smoking. A yearly low-dose CT scan of the lungs is recommended if you:  Currently smoke.  Have a  history of at least 30 pack-years of smoking and you currently smoke or have quit within the past 15 years. A pack-year is smoking an average of one pack of cigarettes per day for one year. Yearly screening should:  Continue until it has been 15 years since you quit.  Stop if you develop a health problem that would prevent you from having lung cancer treatment. Colorectal Cancer  This type of cancer can be detected and can often be prevented.  Routine colorectal cancer screening usually begins at age 50 and continues through age 75.  If you have risk factors for colon cancer, your health care provider may recommend that you be screened at an earlier age.  If you have a family history of colorectal cancer, talk with your health care provider about genetic screening.  Your health care provider may also recommend using home test kits to check for hidden blood in your stool.  A small camera at the end of a tube can be used to examine your colon directly (sigmoidoscopy or colonoscopy). This is done to check for the earliest forms of colorectal cancer.  Direct examination of the colon should be repeated every 5-10 years until age 75. However, if early forms of precancerous polyps or small growths are found or if you have a family history or genetic risk for colorectal cancer, you may need to be screened more often. Skin Cancer  Check your skin from head to toe regularly.  Monitor any moles. Be sure to tell your health care provider:  About any new moles or changes in moles, especially if there is a change in a mole's shape or color.  If you have a mole that is larger than the size of a pencil eraser.  If any of your family members has a history of skin cancer, especially at a [place] age, talk with your health care provider about genetic screening.  Always use sunscreen. Apply sunscreen liberally and repeatedly throughout the day.  Whenever you are outside, protect yourself by wearing long  sleeves, pants, a wide-brimmed hat, and sunglasses. What should I know about osteoporosis? Osteoporosis is a condition in which bone destruction happens more quickly than new bone creation. After menopause, you may be at an increased risk for osteoporosis. To help prevent osteoporosis or the bone fractures that can happen because of osteoporosis, the following is recommended:  If you are 19-50 years old, get at least 1,000 mg of calcium and at least 600 mg of vitamin D per day.  If you are older than age 50 but younger than age   70, get at least 1,200 mg of calcium and at least 600 mg of vitamin D per day.  If you are older than age 70, get at least 1,200 mg of calcium and at least 800 mg of vitamin D per day. Smoking and excessive alcohol intake increase the risk of osteoporosis. Eat foods that are rich in calcium and vitamin D, and do weight-bearing exercises several times each week as directed by your health care provider. What should I know about how menopause affects my mental health? Depression may occur at any age, but it is more common as you become older. Common symptoms of depression include:  Low or sad mood.  Changes in sleep patterns.  Changes in appetite or eating patterns.  Feeling an overall lack of motivation or enjoyment of activities that you previously enjoyed.  Frequent crying spells. Talk with your health care provider if you think that you are experiencing depression. What should I know about immunizations? It is important that you get and maintain your immunizations. These include:  Tetanus, diphtheria, and pertussis (Tdap) booster vaccine.  Influenza every year before the flu season begins.  Pneumonia vaccine.  Shingles vaccine. Your health care provider may also recommend other immunizations. This information is not intended to replace advice given to you by your health care provider. Make sure you discuss any questions you have with your health care  provider. Document Released: 07/27/2005 Document Revised: 12/23/2015 Document Reviewed: 03/08/2015 Elsevier Interactive Patient Education  2017 Elsevier Inc.  

## 2016-08-24 LAB — FOLLICLE STIMULATING HORMONE: FSH: 86.7 m[IU]/mL

## 2016-10-31 ENCOUNTER — Encounter: Payer: Self-pay | Admitting: Gynecology

## 2017-03-13 DIAGNOSIS — M9903 Segmental and somatic dysfunction of lumbar region: Secondary | ICD-10-CM | POA: Diagnosis not present

## 2017-03-13 DIAGNOSIS — M461 Sacroiliitis, not elsewhere classified: Secondary | ICD-10-CM | POA: Diagnosis not present

## 2017-03-13 DIAGNOSIS — M9901 Segmental and somatic dysfunction of cervical region: Secondary | ICD-10-CM | POA: Diagnosis not present

## 2017-03-13 DIAGNOSIS — M5013 Cervical disc disorder with radiculopathy, cervicothoracic region: Secondary | ICD-10-CM | POA: Diagnosis not present

## 2017-03-14 DIAGNOSIS — M9901 Segmental and somatic dysfunction of cervical region: Secondary | ICD-10-CM | POA: Diagnosis not present

## 2017-03-14 DIAGNOSIS — M5416 Radiculopathy, lumbar region: Secondary | ICD-10-CM | POA: Diagnosis not present

## 2017-03-14 DIAGNOSIS — M5013 Cervical disc disorder with radiculopathy, cervicothoracic region: Secondary | ICD-10-CM | POA: Diagnosis not present

## 2017-03-14 DIAGNOSIS — M9903 Segmental and somatic dysfunction of lumbar region: Secondary | ICD-10-CM | POA: Diagnosis not present

## 2017-03-18 DIAGNOSIS — M5013 Cervical disc disorder with radiculopathy, cervicothoracic region: Secondary | ICD-10-CM | POA: Diagnosis not present

## 2017-03-18 DIAGNOSIS — M9901 Segmental and somatic dysfunction of cervical region: Secondary | ICD-10-CM | POA: Diagnosis not present

## 2017-03-18 DIAGNOSIS — M9903 Segmental and somatic dysfunction of lumbar region: Secondary | ICD-10-CM | POA: Diagnosis not present

## 2017-03-18 DIAGNOSIS — M5416 Radiculopathy, lumbar region: Secondary | ICD-10-CM | POA: Diagnosis not present

## 2017-03-19 DIAGNOSIS — M5013 Cervical disc disorder with radiculopathy, cervicothoracic region: Secondary | ICD-10-CM | POA: Diagnosis not present

## 2017-03-19 DIAGNOSIS — M9903 Segmental and somatic dysfunction of lumbar region: Secondary | ICD-10-CM | POA: Diagnosis not present

## 2017-03-19 DIAGNOSIS — M9901 Segmental and somatic dysfunction of cervical region: Secondary | ICD-10-CM | POA: Diagnosis not present

## 2017-03-19 DIAGNOSIS — M5416 Radiculopathy, lumbar region: Secondary | ICD-10-CM | POA: Diagnosis not present

## 2017-03-27 DIAGNOSIS — M5416 Radiculopathy, lumbar region: Secondary | ICD-10-CM | POA: Diagnosis not present

## 2017-03-27 DIAGNOSIS — M9903 Segmental and somatic dysfunction of lumbar region: Secondary | ICD-10-CM | POA: Diagnosis not present

## 2017-03-27 DIAGNOSIS — M9901 Segmental and somatic dysfunction of cervical region: Secondary | ICD-10-CM | POA: Diagnosis not present

## 2017-03-27 DIAGNOSIS — M5013 Cervical disc disorder with radiculopathy, cervicothoracic region: Secondary | ICD-10-CM | POA: Diagnosis not present

## 2017-08-26 ENCOUNTER — Encounter: Payer: BLUE CROSS/BLUE SHIELD | Admitting: Women's Health

## 2019-12-06 ENCOUNTER — Emergency Department (HOSPITAL_COMMUNITY): Payer: BLUE CROSS/BLUE SHIELD

## 2019-12-06 ENCOUNTER — Emergency Department (HOSPITAL_COMMUNITY): Admission: EM | Admit: 2019-12-06 | Discharge: 2019-12-06 | Discharge status: Absent without leave | Payer: Self-pay

## 2019-12-06 ENCOUNTER — Emergency Department (HOSPITAL_COMMUNITY)
Admission: EM | Admit: 2019-12-06 | Discharge: 2019-12-06 | Disposition: A | Discharge status: Home / Self Care | Payer: BLUE CROSS/BLUE SHIELD | Attending: Emergency Medicine | Admitting: Emergency Medicine

## 2019-12-06 ENCOUNTER — Other Ambulatory Visit: Payer: Self-pay

## 2019-12-06 ENCOUNTER — Encounter (HOSPITAL_COMMUNITY): Payer: Self-pay | Admitting: Emergency Medicine

## 2019-12-06 DIAGNOSIS — Z79818 Long term (current) use of other agents affecting estrogen receptors and estrogen levels: Secondary | ICD-10-CM | POA: Insufficient documentation

## 2019-12-06 DIAGNOSIS — R1011 Right upper quadrant pain: Secondary | ICD-10-CM | POA: Insufficient documentation

## 2019-12-06 DIAGNOSIS — Z79899 Other long term (current) drug therapy: Secondary | ICD-10-CM | POA: Insufficient documentation

## 2019-12-06 DIAGNOSIS — R000 Tachycardia, unspecified: Secondary | ICD-10-CM | POA: Diagnosis not present

## 2019-12-06 DIAGNOSIS — R112 Nausea with vomiting, unspecified: Secondary | ICD-10-CM | POA: Diagnosis not present

## 2019-12-06 DIAGNOSIS — K7689 Other specified diseases of liver: Secondary | ICD-10-CM | POA: Diagnosis not present

## 2019-12-06 DIAGNOSIS — Z792 Long term (current) use of antibiotics: Secondary | ICD-10-CM | POA: Diagnosis not present

## 2019-12-06 DIAGNOSIS — R109 Unspecified abdominal pain: Secondary | ICD-10-CM | POA: Diagnosis not present

## 2019-12-06 LAB — URINALYSIS, ROUTINE W REFLEX MICROSCOPIC
Bilirubin Urine: NEGATIVE
Glucose, UA: NEGATIVE mg/dL
Hgb urine dipstick: NEGATIVE
Ketones, ur: NEGATIVE mg/dL
Nitrite: NEGATIVE
Protein, ur: NEGATIVE mg/dL
Specific Gravity, Urine: 1.006 (ref 1.005–1.030)
pH: 8 (ref 5.0–8.0)

## 2019-12-06 LAB — CBC
HCT: 41.8 % (ref 36.0–46.0)
Hemoglobin: 14.9 g/dL (ref 12.0–15.0)
MCH: 30.7 pg (ref 26.0–34.0)
MCHC: 35.6 g/dL (ref 30.0–36.0)
MCV: 86.2 fL (ref 80.0–100.0)
Platelets: 260 10*3/uL (ref 150–400)
RBC: 4.85 MIL/uL (ref 3.87–5.11)
RDW: 12.9 % (ref 11.5–15.5)
WBC: 16.3 10*3/uL — ABNORMAL HIGH (ref 4.0–10.5)
nRBC: 0 % (ref 0.0–0.2)

## 2019-12-06 LAB — COMPREHENSIVE METABOLIC PANEL
ALT: 29 U/L (ref 0–44)
AST: 24 U/L (ref 15–41)
Albumin: 4.5 g/dL (ref 3.5–5.0)
Alkaline Phosphatase: 92 U/L (ref 38–126)
Anion gap: 10 (ref 5–15)
BUN: 13 mg/dL (ref 6–20)
CO2: 26 mmol/L (ref 22–32)
Calcium: 8.7 mg/dL — ABNORMAL LOW (ref 8.9–10.3)
Chloride: 104 mmol/L (ref 98–111)
Creatinine, Ser: 0.83 mg/dL (ref 0.44–1.00)
GFR calc Af Amer: >60 mL/min (ref >60)
GFR calc non Af Amer: >60 mL/min (ref >60)
Glucose, Bld: 172 mg/dL — ABNORMAL HIGH (ref 70–99)
Potassium: 3.3 mmol/L — ABNORMAL LOW (ref 3.5–5.1)
Sodium: 140 mmol/L (ref 135–145)
Total Bilirubin: 0.9 mg/dL (ref 0.3–1.2)
Total Protein: 7.4 g/dL (ref 6.5–8.1)

## 2019-12-06 LAB — LIPASE, BLOOD: Lipase: 35 U/L (ref 11–51)

## 2019-12-06 LAB — HCG, QUANTITATIVE, PREGNANCY: hCG, Beta Chain, Quant, S: 2 m[IU]/mL (ref <5)

## 2019-12-06 MED ORDER — ONDANSETRON 4 MG PO TBDP
4.0000 mg | ORAL_TABLET | Freq: Once | ORAL | Status: AC | PRN
  Administered 2019-12-06: 4 mg via ORAL
  Filled 2019-12-06: qty 1

## 2019-12-06 MED ORDER — SODIUM CHLORIDE 0.9% FLUSH: 3.0000 mL | Freq: Once | INTRAVENOUS | Status: DC

## 2019-12-06 MED ORDER — ONDANSETRON HCL 4 MG PO TABS: 4.0000 mg | ORAL_TABLET | Freq: Three times a day (TID) | ORAL | 0 refills | Status: DC | PRN

## 2019-12-06 MED ORDER — SODIUM CHLORIDE 0.9 % IV BOLUS
1000.0000 mL | Freq: Once | INTRAVENOUS | Status: AC
  Administered 2019-12-06: 1000 mL via INTRAVENOUS

## 2019-12-06 MED ORDER — ONDANSETRON HCL 4 MG/2ML IJ SOLN
INTRAMUSCULAR | Status: AC
  Administered 2019-12-06: 4 mg
  Filled 2019-12-06: qty 2

## 2019-12-06 MED ORDER — MORPHINE SULFATE (PF) 4 MG/ML IV SOLN
4.0000 mg | Freq: Once | INTRAVENOUS | Status: AC
  Administered 2019-12-06: 4 mg via INTRAVENOUS
  Filled 2019-12-06: qty 1

## 2019-12-06 NOTE — ED Provider Notes (Signed)
[facility]-EMERGENCY DEPT Provider Note   CSN: [id number] Arrival date & time: 12/06/19  0330     History Chief Complaint  Patient presents with  . Abdominal Pain  . Emesis    Christine Mckinney is a 49 y.o. female presenting for evaluation of abdominal pain.  Patient states over the past 2 days, she has had intermittent right upper quadrant abdominal pain.  Last night at 1 AM she developed severe persistent abdominal pain, which has been constant.  She reports associated nausea and vomiting.  She denies fevers, chills, chest pain, shortness of breath, urinary symptoms, abnormal bowel movements.  She denies previous history of abdominal problems or abdominal surgeries.  She has not taken anything for pain.  She has no other medical problems, takes no medications daily.  HPI     Past Medical History:  Diagnosis Date  . Dysmenorrhea   . Metrorrhagia     Patient Active Problem List   Diagnosis Date Noted  . Neck mass 08/28/2013    Past Surgical History:  Procedure Laterality Date  . CESAREAN SECTION    . UMBILICAL HERNIA REPAIR       OB History    Gravida  3   Para  3   Term      Preterm      AB      Living  3     SAB      TAB      Ectopic      Multiple      Live Births              Family History  Problem Relation Age of Onset  . Diabetes Mellitus II Other   . Heart disease Other   . Cervical cancer Other   . Osteoporosis Other   . Hyperlipidemia Other   . Breast cancer Sister 37    Social History   Tobacco Use  . Smoking status: Never Smoker  . Smokeless tobacco: Never Used  Substance Use Topics  . Alcohol use: No    Alcohol/week: 0.0 standard drinks  . Drug use: No    Home Medications Prior to Admission medications   Medication Sig Start Date End Date Taking? Authorizing Provider  estradiol (VIVELLE-DOT) 0.05 MG/24HR patch Place 1 patch (0.05 mg total) onto the skin 2 (two) times a week. 08/23/16  Yes Harrington Challenger, NP  Multiple Vitamin (MULTIVITAMIN) tablet Take 1 tablet by mouth daily.   Yes [provider]  progesterone (PROMETRIUM) 200 MG capsule Take 1 capsule (200 mg total) by mouth daily. 08/23/16  Yes Harrington Challenger, NP  HYDROcodone-acetaminophen (NORCO/VICODIN) 5-325 MG per tablet Take 1 tablet by mouth every 4 (four) hours as needed. Patient not taking: Reported on 08/23/2016 07/30/14   Jaynie Crumble, PA-C  ondansetron (ZOFRAN) 4 MG tablet Take 1 tablet (4 mg total) by mouth every 8 (eight) hours as needed. 12/06/19   Treena Cosman, PA-C  sulfamethoxazole-trimethoprim (BACTRIM DS,SEPTRA DS) 800-160 MG per tablet Take 1 tablet by mouth 2 (two) times daily. Patient not taking: Reported on 08/23/2015 09/03/14   Harrington Challenger, NP    Allergies    Patient has no known allergies.  Review of Systems   Review of Systems  Gastrointestinal: Positive for abdominal pain, nausea and vomiting.  All other systems reviewed and are negative.   Physical Exam Updated Vital Signs BP 128/88   Pulse 87   Temp 98 F (36.7 C) (Oral)  Resp (!) 25   Ht 5\' 1"  (1.549 m)   Wt 59 kg   SpO2 100%   BMI 24.56 kg/m   Physical Exam Vitals and nursing note reviewed.  Constitutional:      General: She is not in acute distress.    Appearance: She is well-developed.     Comments: Patient appears very uncomfortable.  Otherwise nontoxic  HENT:     Head: Normocephalic and atraumatic.  Eyes:     Conjunctiva/sclera: Conjunctivae normal.     Pupils: Pupils are equal, round, and reactive to light.  Cardiovascular:     Rate and Rhythm: Regular rhythm. Tachycardia present.     Pulses: Normal pulses.     Comments: Mildly tachycardic around 115 Pulmonary:     Effort: Pulmonary effort is normal. No respiratory distress.     Breath sounds: Normal breath sounds. No wheezing.  Abdominal:     General: There is no distension.     Palpations: Abdomen is soft. There is no mass.     Tenderness: There is  abdominal tenderness. There is no guarding or rebound.     Comments: Tenderness palpation of right upper quadrant abdomen.  Positive Murphy's.  No rigidity, guarding, distention.  Negative rebound.  No peritonitis.  Musculoskeletal:        General: Normal range of motion.     Cervical back: Normal range of motion and neck supple.  Skin:    General: Skin is warm and dry.     Capillary Refill: Capillary refill takes less than 2 seconds.  Neurological:     Mental Status: She is alert and oriented to person, place, and time.     ED Results / Procedures / Treatments   Labs (all labs ordered are listed, but only abnormal results are displayed) Labs Reviewed  COMPREHENSIVE METABOLIC PANEL - Abnormal; Notable for the following components:      Result Value   Potassium 3.3 (*)    Glucose, Bld 172 (*)    Calcium 8.7 (*)    All other components within normal limits  CBC - Abnormal; Notable for the following components:   WBC 16.3 (*)    All other components within normal limits  URINALYSIS, ROUTINE W REFLEX MICROSCOPIC - Abnormal; Notable for the following components:   APPearance HAZY (*)    Leukocytes,Ua TRACE (*)    Bacteria, UA RARE (*)    All other components within normal limits  LIPASE, BLOOD  HCG, QUANTITATIVE, PREGNANCY  I-STAT BETA HCG BLOOD, ED ([facility], [facility], [facility] ONLY)    EKG None  Radiology US Abdomen Limited RUQ  Result Date: 12/06/2019 CLINICAL DATA:  Abdominal pain, RIGHT lower quadrant pain for 2-3 days EXAM: ULTRASOUND ABDOMEN LIMITED RIGHT UPPER QUADRANT COMPARISON:  None. FINDINGS: Gallbladder: No gallstones or wall thickening visualized. No sonographic Murphy sign noted by sonographer. Common bile duct: Diameter: Normal at 5 mm Liver: No focal lesion identified. Increased parenchymal echogenicity. Portal vein is patent on color Doppler imaging with normal direction of blood flow towards the liver. Other: None. IMPRESSION: 1. Normal gallbladder and biliary tree. 2.  Increased liver echogenicity commonly represents hepatic steatosis. Electronically Signed   By: Suzy Bouchard M.D.   On: 12/06/2019 07:27    Procedures Procedures (including critical care time)  Medications Ordered in ED Medications  sodium chloride flush (NS) 0.9 % injection 3 mL (has no administration in time range)  ondansetron (ZOFRAN-ODT) disintegrating tablet 4 mg (4 mg Oral Given 12/06/19 0350)  morphine 4  MG/ML injection 4 mg (4 mg Intravenous Given 12/06/19 0448)  sodium chloride 0.9 % bolus 1,000 mL (1,000 mLs Intravenous New Bag/Given 12/06/19 0447)  ondansetron (ZOFRAN) 4 MG/2ML injection (4 mg  Given 12/06/19 0448)    ED Course  I have reviewed the triage vital signs and the nursing notes.  Pertinent labs & imaging results that were available during my care of the patient were reviewed by me and considered in my medical decision making (see chart for details).    MDM Rules/Calculators/A&P                          Patient presenting for evaluation of right-sided abdominal pain.  On exam, patient appears uncomfortable, but otherwise nontoxic.  She has focal right upper quadrant tenderness with a positive Murphy's.  High suspicion for gallbladder etiology. Will order labs and RUQ [place]. Morphine for pain.  zofran given in triage, pt reports resolution of nausea.   Labs interpreted by me, shows leukocytosis of 16, otherwise reassuring.  LFTs are normal.  Ultrasound negative for acute finding including gallbladder wall thickening, biliary duct dilation, or gallstone.  Discussed with patient.  Discussed that based on her severe pain when she first came in, but encouraged her to get a CT.  However as patient is symptom-free at this time, she would prefer to go home.  Will p.o. challenge prior to discharge.  Patient tolerated p.o. without difficulty.  Discussed prompt return to the ER with any recurrence of severe pain, fevers, persistent vomiting.  At this time, patient appears safe  for discharge.  Return precautions given.  Patient states she understands and agrees to plan.  Final Clinical Impression(s) / ED Diagnoses Final diagnoses:  Abdominal pain  Right sided abdominal pain  Non-intractable vomiting with nausea, unspecified vomiting type    Rx / DC Orders ED Discharge Orders         Ordered    ondansetron (ZOFRAN) 4 MG tablet  Every 8 hours PRN     Discontinue  Reprint     12/06/19 0747           Rakeen Gaillard, PA-C 12/06/19 0749    Devoria Albe, MD 12/06/19 [phone]

## 2019-12-06 NOTE — ED Triage Notes (Signed)
Patient comes in complaining of abdominal pain and emesis. Patient has been having RUQ pain for 2-3 days, but 2 hours ago the pain became intense. Patient states she did start vomiting. Patient denies fevers or changes in bowel movements. Denies urinary symptoms.

## 2019-12-06 NOTE — Discharge Instructions (Signed)
Use Zofran as needed for nausea vomiting. Make sure you are staying well-hydrated water. Use Tylenol or ibuprofen as needed for mild to moderate pain. Eat a bland diet until symptoms have completely resolved.  Return to the emergency room if you develop fevers, persistent vomiting, blood in your vomit, or severe worsening pain.

## 2019-12-06 NOTE — ED Notes (Signed)
She is awake, alert and in no distress. She tells me she is comfortable. Her husband is with her.

## 2019-12-07 ENCOUNTER — Other Ambulatory Visit: Payer: Self-pay | Admitting: Women's Health

## 2019-12-07 ENCOUNTER — Other Ambulatory Visit: Payer: Self-pay | Admitting: Nurse Practitioner

## 2019-12-07 DIAGNOSIS — Z1231 Encounter for screening mammogram for malignant neoplasm of breast: Secondary | ICD-10-CM

## 2019-12-14 ENCOUNTER — Other Ambulatory Visit: Payer: Self-pay

## 2019-12-14 ENCOUNTER — Ambulatory Visit
Admission: RE | Admit: 2019-12-14 | Discharge: 2019-12-14 | Disposition: A | Discharge status: Home / Self Care | Payer: BLUE CROSS/BLUE SHIELD | Source: Ambulatory Visit | Attending: Nurse Practitioner | Admitting: Nurse Practitioner

## 2019-12-14 DIAGNOSIS — Z1231 Encounter for screening mammogram for malignant neoplasm of breast: Secondary | ICD-10-CM | POA: Diagnosis not present

## 2019-12-15 ENCOUNTER — Other Ambulatory Visit: Payer: Self-pay

## 2019-12-15 ENCOUNTER — Encounter: Payer: Self-pay | Admitting: Nurse Practitioner

## 2019-12-15 ENCOUNTER — Ambulatory Visit (INDEPENDENT_AMBULATORY_CARE_PROVIDER_SITE_OTHER): Payer: BC Managed Care – PPO | Admitting: Nurse Practitioner

## 2019-12-15 VITALS — BP 122/80 | Ht 61.0 in | Wt 132.0 lb

## 2019-12-15 DIAGNOSIS — Z01419 Encounter for gynecological examination (general) (routine) without abnormal findings: Secondary | ICD-10-CM | POA: Diagnosis not present

## 2019-12-15 DIAGNOSIS — Z124 Encounter for screening for malignant neoplasm of cervix: Secondary | ICD-10-CM

## 2019-12-15 DIAGNOSIS — Z780 Asymptomatic menopausal state: Secondary | ICD-10-CM

## 2019-12-15 NOTE — Patient Instructions (Signed)
Health Maintenance, Female Adopting a healthy lifestyle and getting preventive care are important in promoting health and wellness. Ask your health care provider about:  The right schedule for you to have regular tests and exams.  Things you can do on your own to prevent diseases and keep yourself healthy. What should I know about diet, weight, and exercise? Eat a healthy diet   Eat a diet that includes plenty of vegetables, fruits, low-fat dairy products, and lean protein.  Do not eat a lot of foods that are high in solid fats, added sugars, or sodium. Maintain a healthy weight Body mass index (BMI) is used to identify weight problems. It estimates body fat based on height and weight. Your health care provider can help determine your BMI and help you achieve or maintain a healthy weight. Get regular exercise Get regular exercise. This is one of the most important things you can do for your health. Most adults should:  Exercise for at least 150 minutes each week. The exercise should increase your heart rate and make you sweat (moderate-intensity exercise).  Do strengthening exercises at least twice a week. This is in addition to the moderate-intensity exercise.  Spend less time sitting. Even light physical activity can be beneficial. Watch cholesterol and blood lipids Have your blood tested for lipids and cholesterol at 47 years of age, then have this test every 5 years. Have your cholesterol levels checked more often if:  Your lipid or cholesterol levels are high.  You are older than 47 years of age.  You are at high risk for heart disease. What should I know about cancer screening? Depending on your health history and family history, you may need to have cancer screening at various ages. This may include screening for:  Breast cancer.  Cervical cancer.  Colorectal cancer.  Skin cancer.  Lung cancer. What should I know about heart disease, diabetes, and high blood  pressure? Blood pressure and heart disease  High blood pressure causes heart disease and increases the risk of stroke. This is more likely to develop in people who have high blood pressure readings, are of African descent, or are overweight.  Have your blood pressure checked: ? Every 3-5 years if you are 18-39 years of age. ? Every year if you are 40 years old or older. Diabetes Have regular diabetes screenings. This checks your fasting blood sugar level. Have the screening done:  Once every three years after age 40 if you are at a normal weight and have a low risk for diabetes.  More often and at a younger age if you are overweight or have a high risk for diabetes. What should I know about preventing infection? Hepatitis B If you have a higher risk for hepatitis B, you should be screened for this virus. Talk with your health care provider to find out if you are at risk for hepatitis B infection. Hepatitis C Testing is recommended for:  Everyone born from 1945 through 1965.  Anyone with known risk factors for hepatitis C. Sexually transmitted infections (STIs)  Get screened for STIs, including gonorrhea and chlamydia, if: ? You are sexually active and are younger than 47 years of age. ? You are older than 47 years of age and your health care provider tells you that you are at risk for this type of infection. ? Your sexual activity has changed since you were last screened, and you are at increased risk for chlamydia or gonorrhea. Ask your health care provider if   you are at risk.  Ask your health care provider about whether you are at high risk for HIV. Your health care provider may recommend a prescription medicine to help prevent HIV infection. If you choose to take medicine to prevent HIV, you should first get tested for HIV. You should then be tested every 3 months for as long as you are taking the medicine. Pregnancy  If you are about to stop having your period (premenopausal) and  you may become pregnant, seek counseling before you get pregnant.  Take 400 to 800 micrograms (mcg) of folic acid every day if you become pregnant.  Ask for birth control (contraception) if you want to prevent pregnancy. Osteoporosis and menopause Osteoporosis is a disease in which the bones lose minerals and strength with aging. This can result in bone fractures. If you are 65 years old or older, or if you are at risk for osteoporosis and fractures, ask your health care provider if you should:  Be screened for bone loss.  Take a calcium or vitamin D supplement to lower your risk of fractures.  Be given hormone replacement therapy (HRT) to treat symptoms of menopause. Follow these instructions at home: Lifestyle  Do not use any products that contain nicotine or tobacco, such as cigarettes, e-cigarettes, and chewing tobacco. If you need help quitting, ask your health care provider.  Do not use street drugs.  Do not share needles.  Ask your health care provider for help if you need support or information about quitting drugs. Alcohol use  Do not drink alcohol if: ? Your health care provider tells you not to drink. ? You are pregnant, may be pregnant, or are planning to become pregnant.  If you drink alcohol: ? Limit how much you use to 0-1 drink a day. ? Limit intake if you are breastfeeding.  Be aware of how much alcohol is in your drink. In the [place] one drink equals one 12 oz bottle of beer (355 mL), one 5 oz glass of wine (148 mL), or one 1 oz glass of hard liquor (44 mL). General instructions  Schedule regular health, dental, and eye exams.  Stay current with your vaccines.  Tell your health care provider if: ? You often feel depressed. ? You have ever been abused or do not feel safe at home. Summary  Adopting a healthy lifestyle and getting preventive care are important in promoting health and wellness.  Follow your health care provider's instructions about healthy  diet, exercising, and getting tested or screened for diseases.  Follow your health care provider's instructions on monitoring your cholesterol and blood pressure. This information is not intended to replace advice given to you by your health care provider. Make sure you discuss any questions you have with your health care provider. Document Revised: 05/28/2018 Document Reviewed: 05/28/2018 Elsevier Patient Education  2020 [employer]  

## 2019-12-15 NOTE — Progress Notes (Signed)
   Christine Mckinney 09-01-1970 [id number]   History:  49 y.o. G3 P3 presents as new patient to reestablish care. Postmenopausal - no HRT, no bleeding. Was on HRT in the past but says she had some weight gain. She does have some occasional hot flashes. Normal pap and mammogram history. Sister diagnosed with breast cancer at age 86, passed away at 82.   Gynecologic History Patient's last menstrual period was 04/08/2016.   Contraception: post menopausal status Last Pap: 08/23/2014. Results were: normal Last mammogram: 12/14/2019. Results were: results not available yet    Past medical history, past surgical history, family history and social history were all reviewed and documented in the EPIC chart.  ROS:  A ROS was performed and pertinent positives and negatives are included.  Exam:  Vitals:   12/15/19 1541  BP: 122/80  Weight: 132 lb (59.9 kg)  Height: 5\' 1"  (1.549 m)   Body mass index is 24.94 kg/m.  General appearance:  Normal Thyroid:  Symmetrical, normal in size, without palpable masses or nodularity. Respiratory  Auscultation:  Clear without wheezing or rhonchi Cardiovascular  Auscultation:  Regular rate, without rubs, murmurs or gallops  Edema/varicosities:  Not grossly evident Abdominal  Soft,nontender, without masses, guarding or rebound.  Liver/spleen:  No organomegaly noted  Hernia:  None appreciated  Skin  Inspection:  Grossly normal   Breasts: Examined lying and sitting.   Right: Without masses, retractions, discharge or axillary adenopathy.   Left: Without masses, retractions, discharge or axillary adenopathy. Gentitourinary   Inguinal/mons:  Normal without inguinal adenopathy  External genitalia:  Normal  BUS/Urethra/Skene's glands:  Normal  Vagina:  Normal  Cervix:  Normal  Uterus:  Anteverted, normal in size, shape and contour.  Midline and mobile  Adnexa/parametria:     Rt: Without masses or tenderness.   Lt: Without masses or tenderness.  Anus and  perineum: Normal  Digital rectal exam: Normal sphincter tone without palpated masses or tenderness  Assessment/Plan:  49 y.o. G3 P3 as new patient for annual exam.   Well female exam with routine gynecological exam - Education provided on SBEs, importance of preventative screenings, current guidelines, high calcium diet, regular exercise, and multivitamin daily. Labs done with PCP.   Postmenopausal - minimal symptoms. Occasional hot flashes  Encounter for Papanicolaou smear for cervical cancer screening - Plan: PAP,TP IMGw/HPV RNA,rflx HPVTYPE16,18/45  Follow up in 1 year for annual      Girlie Veltri A [facility], 3:50 PM 12/15/2019

## 2019-12-16 LAB — PAP, TP IMAGING W/ HPV RNA, RFLX HPV TYPE 16,18/45: HPV DNA High Risk: NOT DETECTED

## 2019-12-18 ENCOUNTER — Other Ambulatory Visit: Payer: Self-pay | Admitting: Nurse Practitioner

## 2019-12-18 DIAGNOSIS — R928 Other abnormal and inconclusive findings on diagnostic imaging of breast: Secondary | ICD-10-CM

## 2019-12-24 ENCOUNTER — Other Ambulatory Visit: Payer: Self-pay

## 2019-12-24 ENCOUNTER — Ambulatory Visit: Payer: BLUE CROSS/BLUE SHIELD

## 2019-12-24 ENCOUNTER — Ambulatory Visit
Admission: RE | Admit: 2019-12-24 | Discharge: 2019-12-24 | Disposition: A | Discharge status: Home / Self Care | Payer: BLUE CROSS/BLUE SHIELD | Source: Ambulatory Visit | Attending: Nurse Practitioner | Admitting: Nurse Practitioner

## 2019-12-24 DIAGNOSIS — R928 Other abnormal and inconclusive findings on diagnostic imaging of breast: Secondary | ICD-10-CM | POA: Diagnosis not present

## 2020-08-02 DIAGNOSIS — Z01818 Encounter for other preprocedural examination: Secondary | ICD-10-CM | POA: Diagnosis not present

## 2021-08-15 DIAGNOSIS — J358 Other chronic diseases of tonsils and adenoids: Secondary | ICD-10-CM | POA: Diagnosis not present

## 2021-08-25 DIAGNOSIS — J3501 Chronic tonsillitis: Secondary | ICD-10-CM | POA: Diagnosis not present

## 2021-08-25 DIAGNOSIS — J358 Other chronic diseases of tonsils and adenoids: Secondary | ICD-10-CM | POA: Diagnosis not present

## 2021-11-15 ENCOUNTER — Other Ambulatory Visit: Payer: Self-pay | Admitting: Nurse Practitioner

## 2021-11-15 DIAGNOSIS — Z1231 Encounter for screening mammogram for malignant neoplasm of breast: Secondary | ICD-10-CM

## 2021-11-17 ENCOUNTER — Ambulatory Visit
Admission: RE | Admit: 2021-11-17 | Discharge: 2021-11-17 | Disposition: A | Discharge status: Home / Self Care | Payer: BC Managed Care – PPO | Source: Ambulatory Visit | Attending: Nurse Practitioner | Admitting: Nurse Practitioner

## 2021-11-17 DIAGNOSIS — Z1231 Encounter for screening mammogram for malignant neoplasm of breast: Secondary | ICD-10-CM

## 2021-11-23 NOTE — Progress Notes (Signed)
   Christine Mckinney June 15, 1971 [id number]   History:  51 y.o. G3 P3 presents for annual exam. Postmenopausal - no HRT, no bleeding. Normal pap and mammogram history. Sister diagnosed with breast cancer at age 38, passed away at 93.   Gynecologic History Patient's last menstrual period was 04/08/2016.   Contraception/Family planning: post menopausal status Sexually active: Yes  Health Maintenance Last Pap: 12/15/2019. Results were: Normal, 5-year repeat Last mammogram: 11/17/2021. Results were: Normal Last colonoscopy: Never Last Dexa: Never  Past medical history, past surgical history, family history and social history were all reviewed and documented in the EPIC chart. Married. 3 sons age 48, 24, and 101. 89 yo grandson.   ROS:  A ROS was performed and pertinent positives and negatives are included.  Exam:  Vitals:   11/24/21 0921  BP: 122/78  Weight: 116 lb (52.6 kg)  Height: 5' 0.5" (1.537 m)    Body mass index is 22.28 kg/m.  General appearance:  Normal Thyroid:  Symmetrical, normal in size, without palpable masses or nodularity. Respiratory  Auscultation:  Clear without wheezing or rhonchi Cardiovascular  Auscultation:  Regular rate, without rubs, murmurs or gallops  Edema/varicosities:  Not grossly evident Abdominal  Soft,nontender, without masses, guarding or rebound.  Liver/spleen:  No organomegaly noted  Hernia:  None appreciated  Skin  Inspection:  Grossly normal   Breasts: Examined lying and sitting.   Right: Without masses, retractions, discharge or axillary adenopathy.   Left: Without masses, retractions, discharge or axillary adenopathy. Genitourinary   Inguinal/mons:  Normal without inguinal adenopathy  External genitalia:  Normal appearing vulva with no masses, tenderness, or lesions  BUS/Urethra/Skene's glands:  Normal  Vagina:  Normal appearing with normal color and discharge, no lesions  Cervix:  Normal appearing without discharge or  lesions  Uterus:  Normal in size, shape and contour.  Midline and mobile, nontender  Adnexa/parametria:     Rt: Normal in size, without masses or tenderness.   Lt: Normal in size, without masses or tenderness.  Anus and perineum: Normal  Digital rectal exam: Normal sphincter tone without palpated masses or tenderness  Patient informed chaperone available to be present for breast and pelvic exam. Patient has requested no chaperone to be present. Patient has been advised what will be completed during breast and pelvic exam.   Assessment/Plan:  51 y.o. G3 P3 as new patient for annual exam.   Well female exam with routine gynecological exam - Education provided on SBEs, importance of preventative screenings, current guidelines, high calcium diet, regular exercise, and multivitamin daily. Recent lab work through her employer.   Postmenopausal - no HRT, no bleeding  Screening for cervical cancer - Normal Pap history.  Will repeat at 5-year interval per guidelines.  Screening for breast cancer - Normal mammogram history.  Continue annual screenings.  Normal breast exam today.  Screening for colon cancer - Has not had screening colonoscopy. Information provided on [facility].   Follow up in 1 year for annual      Olivia Mackie [facility], 9:38 AM 11/24/2021

## 2021-11-24 ENCOUNTER — Ambulatory Visit (INDEPENDENT_AMBULATORY_CARE_PROVIDER_SITE_OTHER): Payer: BC Managed Care – PPO | Admitting: Nurse Practitioner

## 2021-11-24 ENCOUNTER — Encounter: Payer: Self-pay | Admitting: Nurse Practitioner

## 2021-11-24 VITALS — BP 122/78 | Ht 60.5 in | Wt 116.0 lb

## 2021-11-24 DIAGNOSIS — Z01419 Encounter for gynecological examination (general) (routine) without abnormal findings: Secondary | ICD-10-CM

## 2021-11-24 DIAGNOSIS — Z780 Asymptomatic menopausal state: Secondary | ICD-10-CM | POA: Diagnosis not present

## 2021-11-24 NOTE — Patient Instructions (Signed)
Schedule Colonoscopy! ? GI ?[phone] ?[address] [place], [place] [address] ? ?

## 2022-05-23 DIAGNOSIS — L729 Follicular cyst of the skin and subcutaneous tissue, unspecified: Secondary | ICD-10-CM | POA: Diagnosis not present

## 2022-08-29 DIAGNOSIS — Z01818 Encounter for other preprocedural examination: Secondary | ICD-10-CM | POA: Diagnosis not present

## 2022-09-03 DIAGNOSIS — N390 Urinary tract infection, site not specified: Secondary | ICD-10-CM | POA: Diagnosis not present

## 2023-03-03 IMAGING — MG MM DIGITAL SCREENING BILAT W/ TOMO AND CAD
8 series · 8 of 24 positions shown · non-contrast
Comparison: Previous exam(s).

CLINICAL DATA: Screening.

EXAM:
DIGITAL SCREENING BILATERAL MAMMOGRAM WITH TOMOSYNTHESIS AND CAD
TECHNIQUE: Bilateral screening digital craniocaudal and mediolateral oblique
mammograms were obtained. Bilateral screening digital breast
tomosynthesis was performed. The images were evaluated with
computer-aided detection.

[R MLO synth-2D]
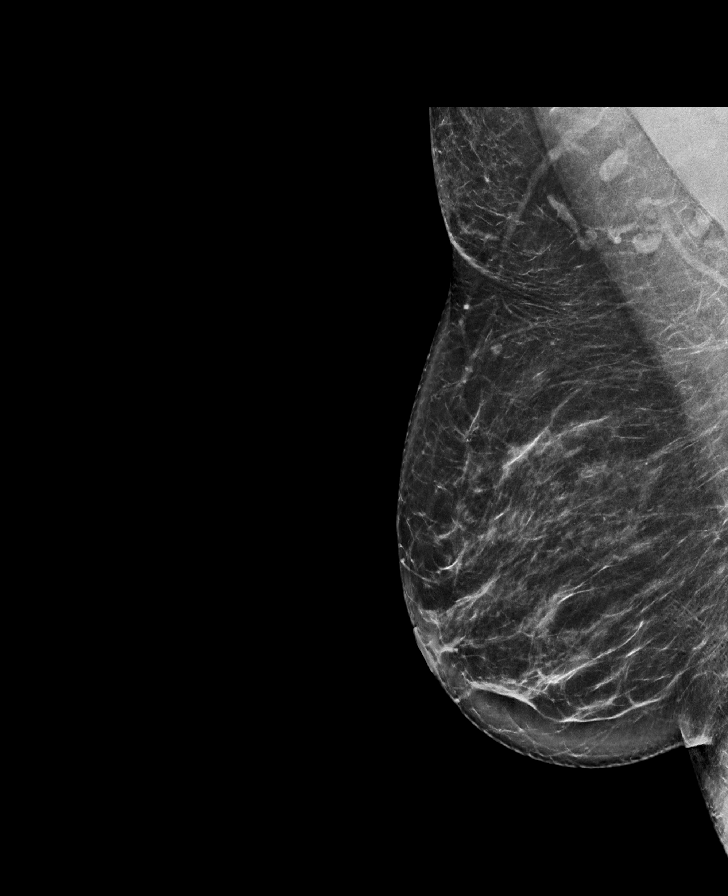

[L CC synth-2D]
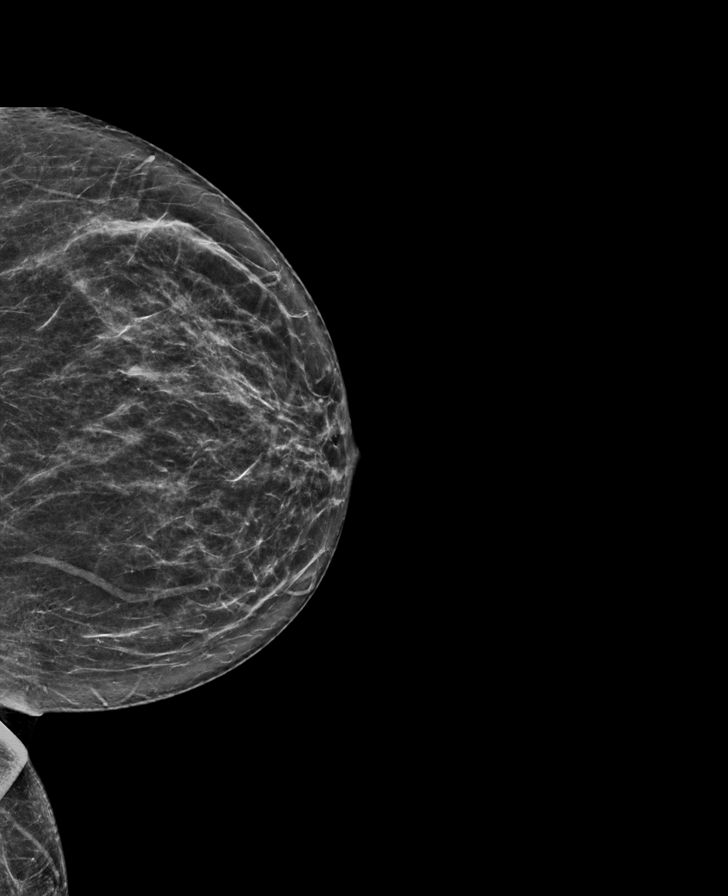

[L MLO synth-2D]
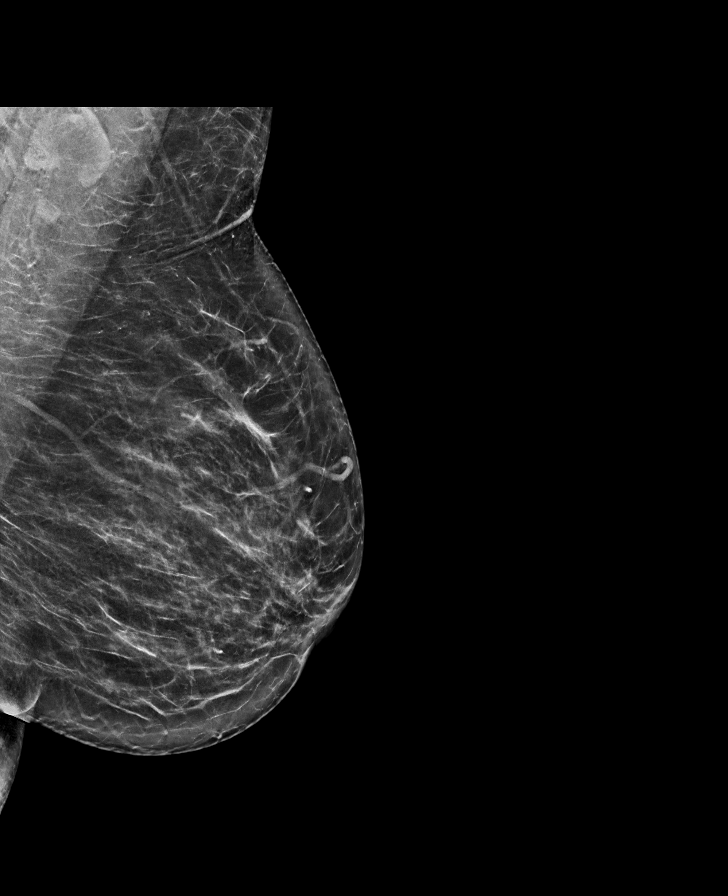

[R CC synth-2D]
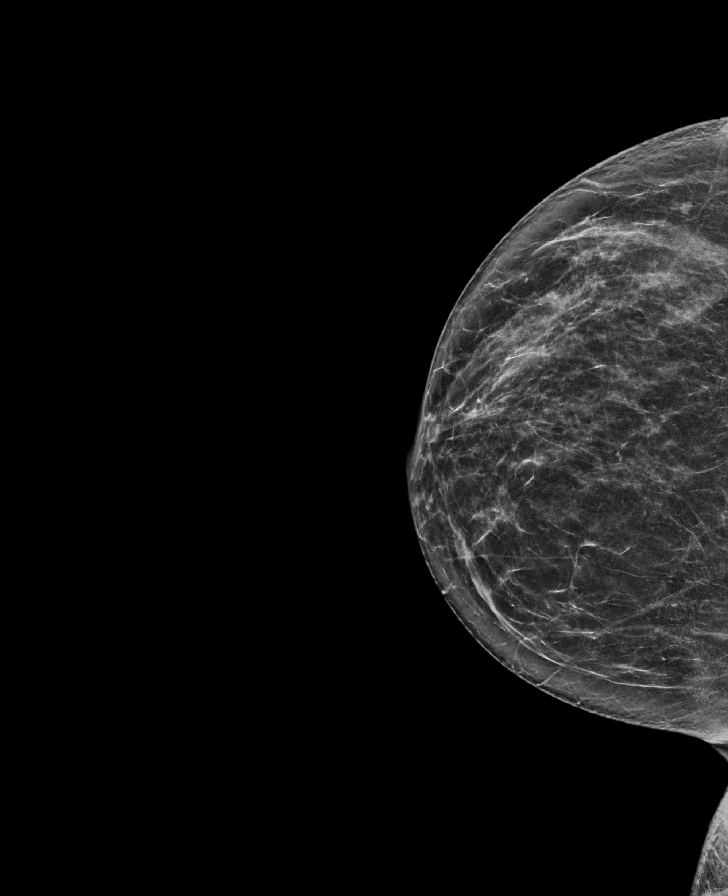

[L CC tomo · tomo slice 35/69.0]
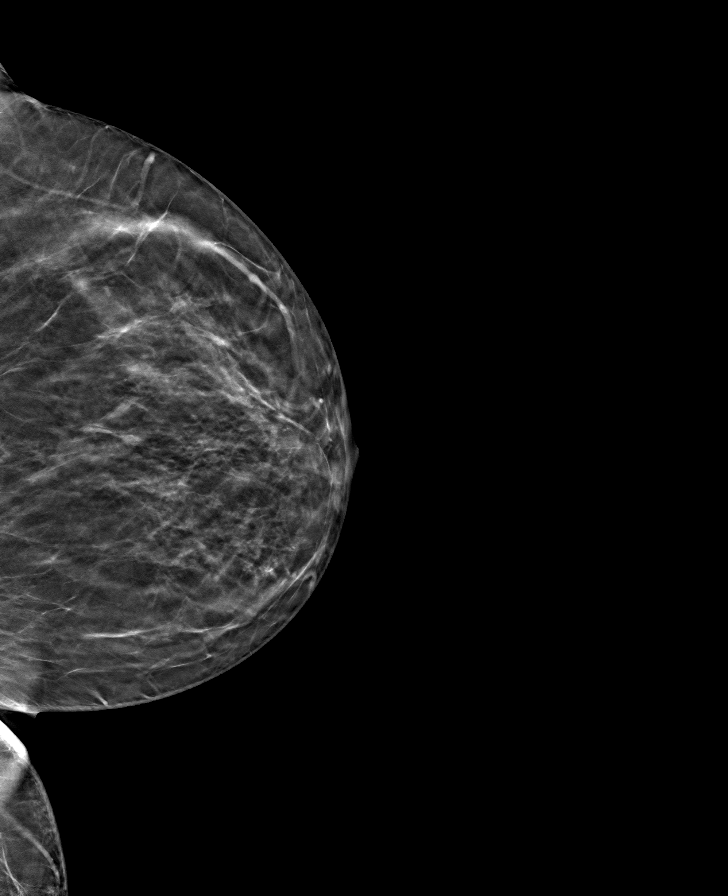

[R MLO tomo · tomo slice 44/87.0]
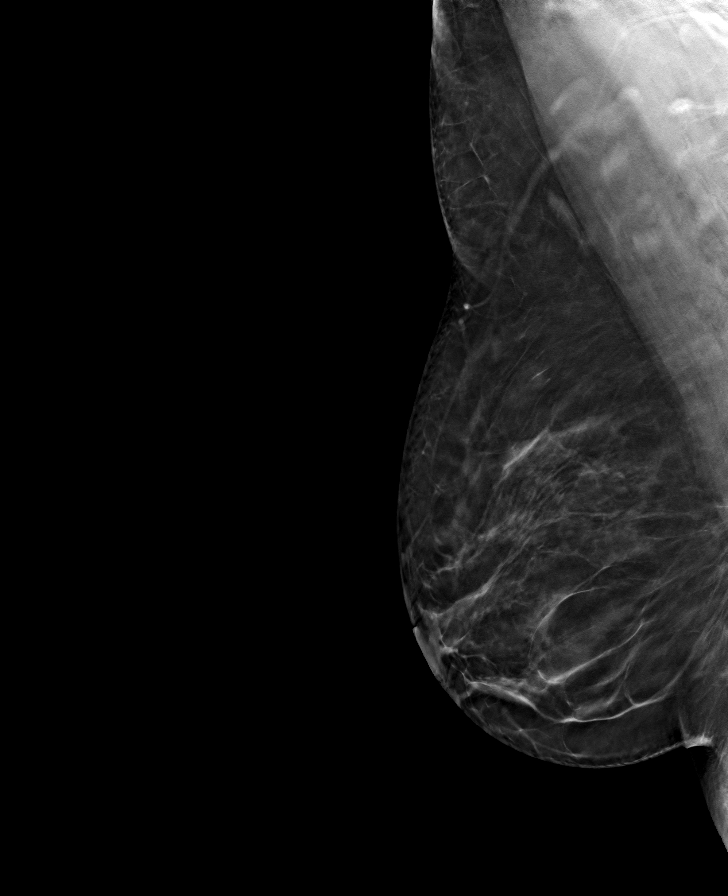

[L MLO tomo · tomo slice 40/79.0]
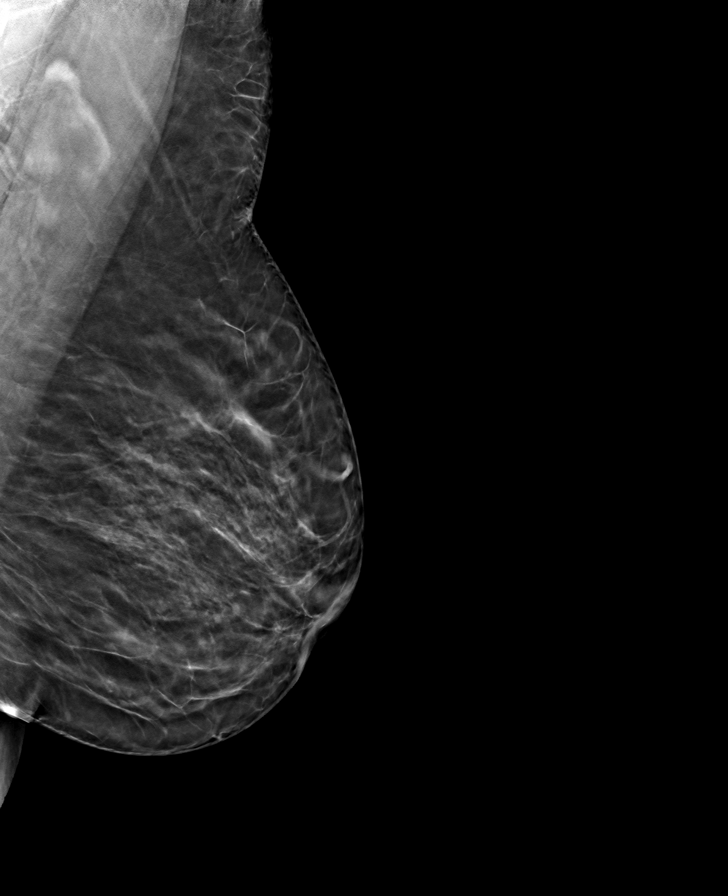

[R CC tomo · tomo slice 35/69.0]
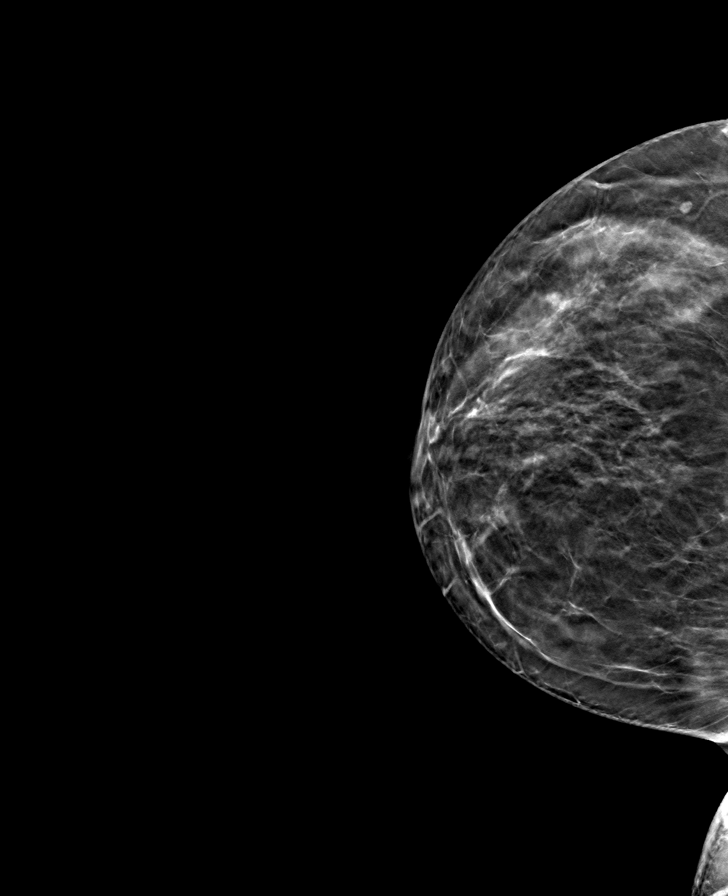

[8 of 24 positions shown; findings below may reference images not displayed]

ACR Breast Density Category b: There are scattered areas of
fibroglandular density.
FINDINGS: There are no findings suspicious for malignancy.
IMPRESSION: No mammographic evidence of malignancy. A result letter of this
screening mammogram will be mailed directly to the patient.

RECOMMENDATION:
Screening mammogram in one year. (Code:51-O-LD2)

BI-RADS CATEGORY  1: Negative.

## 2024-06-26 ENCOUNTER — Ambulatory Visit: Admitting: Nurse Practitioner

## 2024-06-26 NOTE — Progress Notes (Unsigned)
" ° °  Christine Mckinney 10/06/1970 [id number]   History:  54 y.o. G3 P3 presents for annual exam. Postmenopausal - no HRT, no bleeding. Normal pap history. Sister diagnosed with breast cancer at age 33, passed away at 41.   Gynecologic History Patient's last menstrual period was 04/08/2016.   Contraception/Family planning: post menopausal status Sexually active: Yes  Health Maintenance Last Pap: 12/15/2019. Results were: Normal, 5-year repeat Last mammogram: 11/17/2021. Results were: Normal Last colonoscopy: Never Last Dexa: Never      No data to display           Past medical history, past surgical history, family history and social history were all reviewed and documented in the EPIC chart. Married. 3 sons age 10, 89, and 52. 48 yo grandson.   ROS:  A ROS was performed and pertinent positives and negatives are included.  Exam:  There were no vitals filed for this visit.   There is no height or weight on file to calculate BMI.  General appearance:  Normal Thyroid:  Symmetrical, normal in size, without palpable masses or nodularity. Respiratory  Auscultation:  Clear without wheezing or rhonchi Cardiovascular  Auscultation:  Regular rate, without rubs, murmurs or gallops  Edema/varicosities:  Not grossly evident Abdominal  Soft,nontender, without masses, guarding or rebound.  Liver/spleen:  No organomegaly noted  Hernia:  None appreciated  Skin  Inspection:  Grossly normal   Breasts: Examined lying and sitting.   Right: Without masses, retractions, discharge or axillary adenopathy.   Left: Without masses, retractions, discharge or axillary adenopathy. Pelvic: External genitalia:  no lesions              Urethra:  normal appearing urethra with no masses, tenderness or lesions              Bartholins and Skenes: normal                 Vagina: normal appearing vagina with normal color and discharge, no lesions              Cervix: no lesions Bimanual Exam:  Uterus:  no  masses or tenderness              Adnexa: no mass, fullness, tenderness              Rectovaginal: Deferred              Anus:  normal, no lesions   Assessment/Plan:  54 y.o. G3 P3 as new patient for annual exam.   Well female exam with routine gynecological exam - Education provided on SBEs, importance of preventative screenings, current guidelines, high calcium diet, regular exercise, and multivitamin daily. Recent lab work through her employer.   Postmenopausal - no HRT, no bleeding  Screening for cervical cancer - Normal Pap history.  Pap today per guidelines.   Screening for breast cancer - Normal mammogram history.  Continue annual screenings.  Normal breast exam today.  Screening for colon cancer - Has not had screening colonoscopy. Information provided on [facility].   No follow-ups on file.       Annabella DELENA [facility], 11:19 AM 06/26/2024 "

## 2024-08-12 ENCOUNTER — Ambulatory Visit: Admitting: Nurse Practitioner
